# Patient Record
Sex: Male | Born: 1965 | Race: Black or African American | Hispanic: No | Marital: Single
Health system: Southern US, Academic
[De-identification: ages and names within clinical notes are randomized; demographics above are authoritative.]

## PROBLEM LIST (undated history)

## (undated) ENCOUNTER — Telehealth

## (undated) ENCOUNTER — Encounter

## (undated) ENCOUNTER — Encounter: Attending: Nephrology | Primary: Nephrology

## (undated) ENCOUNTER — Encounter: Attending: Gastroenterology | Primary: Gastroenterology

## (undated) ENCOUNTER — Ambulatory Visit: Payer: MEDICARE

## (undated) ENCOUNTER — Inpatient Hospital Stay: Payer: MEDICARE

## (undated) ENCOUNTER — Encounter: Attending: Anesthesiology | Primary: Anesthesiology

## (undated) ENCOUNTER — Ambulatory Visit: Payer: MEDICARE | Attending: Nephrology | Primary: Nephrology

## (undated) ENCOUNTER — Ambulatory Visit: Payer: Medicare (Managed Care)

## (undated) ENCOUNTER — Telehealth: Attending: Certified Registered" | Primary: Certified Registered"

## (undated) ENCOUNTER — Ambulatory Visit

## (undated) DIAGNOSIS — I10 Essential (primary) hypertension: Secondary | ICD-10-CM

## (undated) DIAGNOSIS — E119 Type 2 diabetes mellitus without complications: Secondary | ICD-10-CM

## (undated) HISTORY — PX: COLONOSCOPY: SHX174

## (undated) MED ORDER — TRAMADOL 50 MG TABLET: Freq: Four times a day (QID) | ORAL | 0 days | PRN

## (undated) MED ORDER — SEMAGLUTIDE 0.25 MG OR 0.5 MG (2 MG/1.5 ML) SUBCUTANEOUS PEN INJECTOR: SUBCUTANEOUS | 0 days

## (undated) MED ORDER — INSULIN ASPAR PROT-INSULIN ASPART 100 UNIT/ML (70-30) SUBCUTANEOUS PEN: Freq: Three times a day (TID) | SUBCUTANEOUS | 0 days

## (undated) MED ORDER — LISINOPRIL 10 MG TABLET: Freq: Every day | ORAL | 0.00000 days

---
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
## 2014-06-18 ENCOUNTER — Inpatient Hospital Stay (HOSPITAL_COMMUNITY)
Admission: AD | Admit: 2014-06-18 | Discharge: 2014-06-22 | DRG: 418 | Disposition: A | Discharge status: Home / Self Care | Payer: 59 | Source: Ambulatory Visit | Attending: Internal Medicine | Admitting: Internal Medicine

## 2014-06-18 ENCOUNTER — Encounter (HOSPITAL_COMMUNITY): Payer: Self-pay | Admitting: General Practice

## 2014-06-18 ENCOUNTER — Inpatient Hospital Stay (HOSPITAL_COMMUNITY): Payer: 59

## 2014-06-18 ENCOUNTER — Ambulatory Visit (HOSPITAL_COMMUNITY)
Admission: RE | Admit: 2014-06-18 | Discharge: 2014-06-18 | Disposition: A | Discharge status: Home / Self Care | Payer: 59 | Source: Ambulatory Visit | Attending: Internal Medicine | Admitting: Internal Medicine

## 2014-06-18 ENCOUNTER — Other Ambulatory Visit (HOSPITAL_COMMUNITY): Payer: Self-pay | Admitting: Internal Medicine

## 2014-06-18 DIAGNOSIS — I10 Essential (primary) hypertension: Secondary | ICD-10-CM | POA: Diagnosis present

## 2014-06-18 DIAGNOSIS — Z7982 Long term (current) use of aspirin: Secondary | ICD-10-CM

## 2014-06-18 DIAGNOSIS — I2510 Atherosclerotic heart disease of native coronary artery without angina pectoris: Secondary | ICD-10-CM | POA: Diagnosis present

## 2014-06-18 DIAGNOSIS — R945 Abnormal results of liver function studies: Principal | ICD-10-CM

## 2014-06-18 DIAGNOSIS — E1165 Type 2 diabetes mellitus with hyperglycemia: Secondary | ICD-10-CM | POA: Diagnosis present

## 2014-06-18 DIAGNOSIS — K851 Biliary acute pancreatitis without necrosis or infection: Secondary | ICD-10-CM | POA: Diagnosis present

## 2014-06-18 DIAGNOSIS — E1121 Type 2 diabetes mellitus with diabetic nephropathy: Secondary | ICD-10-CM | POA: Diagnosis present

## 2014-06-18 DIAGNOSIS — Z794 Long term (current) use of insulin: Secondary | ICD-10-CM | POA: Diagnosis not present

## 2014-06-18 DIAGNOSIS — R112 Nausea with vomiting, unspecified: Secondary | ICD-10-CM

## 2014-06-18 DIAGNOSIS — E785 Hyperlipidemia, unspecified: Secondary | ICD-10-CM | POA: Diagnosis present

## 2014-06-18 DIAGNOSIS — R7989 Other specified abnormal findings of blood chemistry: Secondary | ICD-10-CM

## 2014-06-18 DIAGNOSIS — F17210 Nicotine dependence, cigarettes, uncomplicated: Secondary | ICD-10-CM | POA: Diagnosis present

## 2014-06-18 DIAGNOSIS — F17200 Nicotine dependence, unspecified, uncomplicated: Secondary | ICD-10-CM

## 2014-06-18 DIAGNOSIS — K8000 Calculus of gallbladder with acute cholecystitis without obstruction: Secondary | ICD-10-CM | POA: Diagnosis present

## 2014-06-18 DIAGNOSIS — IMO0002 Reserved for concepts with insufficient information to code with codable children: Secondary | ICD-10-CM

## 2014-06-18 DIAGNOSIS — R000 Tachycardia, unspecified: Secondary | ICD-10-CM | POA: Diagnosis not present

## 2014-06-18 DIAGNOSIS — Z9049 Acquired absence of other specified parts of digestive tract: Secondary | ICD-10-CM | POA: Diagnosis present

## 2014-06-18 DIAGNOSIS — R109 Unspecified abdominal pain: Secondary | ICD-10-CM

## 2014-06-18 DIAGNOSIS — K8020 Calculus of gallbladder without cholecystitis without obstruction: Secondary | ICD-10-CM

## 2014-06-18 HISTORY — DX: Type 2 diabetes mellitus without complications: E11.9

## 2014-06-18 LAB — GLUCOSE, CAPILLARY: Glucose-Capillary: 198 mg/dL — ABNORMAL HIGH (ref 70–99)

## 2014-06-18 MED ORDER — INSULIN ASPART 100 UNIT/ML ~~LOC~~ SOLN
0.0000 [IU] | Freq: Three times a day (TID) | SUBCUTANEOUS | Status: DC
  Administered 2014-06-19: 2 [IU] via SUBCUTANEOUS
  Administered 2014-06-20: 5 [IU] via SUBCUTANEOUS
  Administered 2014-06-20: 2 [IU] via SUBCUTANEOUS
  Administered 2014-06-20 – 2014-06-21 (×2): 3 [IU] via SUBCUTANEOUS
  Administered 2014-06-22 (×2): 5 [IU] via SUBCUTANEOUS

## 2014-06-18 MED ORDER — PANTOPRAZOLE SODIUM 40 MG IV SOLR
40.0000 mg | Freq: Two times a day (BID) | INTRAVENOUS | Status: AC
  Administered 2014-06-18 – 2014-06-19 (×3): 40 mg via INTRAVENOUS
  Filled 2014-06-18 (×3): qty 40

## 2014-06-18 MED ORDER — ACETAMINOPHEN 325 MG PO TABS: 650.0000 mg | ORAL_TABLET | Freq: Four times a day (QID) | ORAL | Status: DC | PRN

## 2014-06-18 MED ORDER — IOHEXOL 300 MG/ML  SOLN
50.0000 mL | Freq: Once | INTRAMUSCULAR | Status: AC | PRN
  Administered 2014-06-18: 50 mL via ORAL

## 2014-06-18 MED ORDER — HYDROMORPHONE HCL 1 MG/ML IJ SOLN
0.5000 mg | INTRAMUSCULAR | Status: DC | PRN
Start: 2014-06-18 — End: 2014-06-22
  Administered 2014-06-21 – 2014-06-22 (×2): 0.5 mg via INTRAVENOUS
  Filled 2014-06-18 (×2): qty 1

## 2014-06-18 MED ORDER — ONDANSETRON HCL 4 MG/2ML IJ SOLN: 4.0000 mg | Freq: Four times a day (QID) | INTRAMUSCULAR | Status: DC | PRN

## 2014-06-18 MED ORDER — HEPARIN SODIUM (PORCINE) 5000 UNIT/ML IJ SOLN
5000.0000 [IU] | Freq: Three times a day (TID) | INTRAMUSCULAR | Status: DC
  Administered 2014-06-18 – 2014-06-22 (×8): 5000 [IU] via SUBCUTANEOUS
  Filled 2014-06-18 (×12): qty 1

## 2014-06-18 MED ORDER — HYDRALAZINE HCL 20 MG/ML IJ SOLN
10.0000 mg | Freq: Three times a day (TID) | INTRAMUSCULAR | Status: DC | PRN
  Administered 2014-06-18 – 2014-06-22 (×4): 10 mg via INTRAVENOUS
  Filled 2014-06-18 (×4): qty 1

## 2014-06-18 MED ORDER — SODIUM CHLORIDE 0.9 % IJ SOLN
3.0000 mL | Freq: Two times a day (BID) | INTRAMUSCULAR | Status: DC
  Administered 2014-06-22: 3 mL via INTRAVENOUS

## 2014-06-18 MED ORDER — ACETAMINOPHEN 650 MG RE SUPP: 650.0000 mg | Freq: Four times a day (QID) | RECTAL | Status: DC | PRN

## 2014-06-18 MED ORDER — INSULIN ASPART 100 UNIT/ML ~~LOC~~ SOLN
0.0000 [IU] | Freq: Every day | SUBCUTANEOUS | Status: DC
Start: 2014-06-18 — End: 2014-06-22
  Administered 2014-06-19: 2 [IU] via SUBCUTANEOUS
  Administered 2014-06-21: 1 [IU] via SUBCUTANEOUS

## 2014-06-18 MED ORDER — ONDANSETRON HCL 4 MG PO TABS: 4.0000 mg | ORAL_TABLET | Freq: Four times a day (QID) | ORAL | Status: DC | PRN

## 2014-06-18 MED ORDER — INSULIN GLARGINE 100 UNIT/ML ~~LOC~~ SOLN
15.0000 [IU] | Freq: Every day | SUBCUTANEOUS | Status: DC
  Administered 2014-06-18 – 2014-06-21 (×4): 15 [IU] via SUBCUTANEOUS
  Filled 2014-06-18 (×4): qty 0.15

## 2014-06-18 MED ORDER — SODIUM CHLORIDE 0.9 % IV SOLN
INTRAVENOUS | Status: DC
  Administered 2014-06-18 – 2014-06-21 (×6): via INTRAVENOUS

## 2014-06-18 NOTE — H&P (Addendum)
Vital Signs  Entered weight:  219  lbs., Calculated Weight: 219 lbs., ( 99.34 kg) Height: 68 in., ( 172.72 cm) Temperature: 98.1 deg F, Temperature site: oral Pulse rate: 88 Pulse rhythm: regular  Blood Pressure #1: 156 / 98 mm Hg    BMI: 33.30 BSA: 2.13 Wt Chg: 1 lbs since 05/20/2014  Vitals entered by: Prudencio Pair, CMA on June 18, 2014 3:19 PM  Pulse Oximetry  O2 Saturation: 98 %           Risk Factors:   Smoked Tobacco Use:  Current every day smoker    Cigarettes:  Yes -- 1pk/wk pack(s) per day,      Year started:  1994       Years smoked:  7 Smokeless Tobacco Use:  Never    Counseled to quit/cut down:  yes Passive smoke exposure:  no Drug use:  no HIV high-risk behavior:  no Caffeine use:  <1 drinks per day Alcohol use:  yes    Type:  occ beer    Drinks per day:  <1    Has patient --       Felt need to cut down:  no       Been annoyed by complaints:  no       Felt guilty about drinking:  no       Needed eye opener in the morning:  no    Counseled to quit/cut down alcohol use:  no Exercise:  yes    Times per week:  3    Type of Exercise:  walk Seatbelt use:  100 % Sun Exposure:  frequently  Family History Risk Factors:    Family History of MI in males < 36 years old:  no  Previous Tobacco Use: Signed On - 05/20/2014 Smoked Tobacco Use:  Current every day smoker    Cigarettes:  Yes -- 1pk/wk pack(s) per day,      Year started:  1994       Years smoked:  7 Smokeless Tobacco Use:  Never    Counseled to quit/cut down:  yes Passive smoke exposure:  no Drug use:  no HIV high-risk behavior:  no Caffeine use:  <1 drinks per day  Previous Alcohol Use: Signed On - 05/20/2014 Alcohol use:  yes    Type:  occ beer    Drinks per day:  <1    Has patient --       Felt need to cut down:  no       Been annoyed by complaints:  no       Felt guilty about drinking:  no       Needed eye opener in the morning:  no    Counseled to quit/cut down alcohol  use:  no Exercise:  yes    Times per week:  3    Type of Exercise:  walk Seatbelt use:  100 % Sun Exposure:  frequently  Family History Risk Factors:    Family History of MI in males < 63 years old:  no  History of Present Illness  History from: patient Reason for visit: See chief complaint Chief Complaint: Patient started having upper mid abdominal pain Saturday and it has continued every day. He has had nausea and vomiting, loss of appetite and very dark stool.   History of Present Illness: 49 y/o M presented to office today c 5-6 days Epigastric pains, N, resolved vomiting, Anorexia, back and flank pains.  Started with acute  onset of Mid to LUQ abd pain Sat am with mild nausea and intermittent vomiting during the week and noticing darker stools with no jaundice, fever, chills, or BRPPR.  Felt fine when went to bed on Fri pm.  Hasn't kept him from going to work this wk.  Admits to drinking about a 6 pk beer over w/e and did do that on Friday pm before going to bed.    Says rarely drinks during week  Hx DM with home bs running 98-195 , average 130-149 most times Didn't take Metformin or Insulin today, didn't think about taking it to work as he didn't feel well today and thought may not eat alot Has been eating bland foods and taking in liquids ok No vomiting in 48 hrs  Hgb A1C: 8.5 Last labs in Aug showed creat 1.2, AST/ALT 16/17 and Alk phos 61 Hgb 16.3 Trig 77  He did not look toxic in office and said he was actually feeling better. We did some labs and cr was fine, glucose was 277, LFTs were moderately elevated c T bili norma., and WBC 6.1.  We gave him the option of direct admit or outpt eval c adding on pancreatitis labs and getting CT.  He elected the latter.  The Amylase came back 263 and Lipase came back 547.  CT showed Biliary sludge and stones c Head of Pancreas Pancreatitis.  I called pt and directly admitted him for IVF, pain control, GI/Surg eval as he will need Lap chole  early to mid week after GB cools down.  I repeated TG and they were normal.  I called bed control and he will be directly      GLUCOSE              [H]  277 mg/dl                   60-110   BUN                       10 mg/dl                    5-23   CREATININE                1.0 mg/dl                   0.3-1.5  eGFR Non-African American  80.1  eGFR African American 96.9   SODIUM                    141 mEq/L                   135-148   POTASSIUM                 3.7 mEq/L                   3.5-5.3   CHLORIDE                  103 mEq/L                   80-111   CO2                  [H]  37 mEq/L                    15-35   CALCIUM  8.4 mg/dL                   7.0-10.5   TOTAL PROTEIN             6.7 g/dL                    6.0-8.5   ALBUMIN                   3.1 g/dL                    2.0-5.5   AST                  [H]  70 IU/L                     7-45   ALT                  [HH] 114 IU/L                    5-40     RES=RESULT VERIFIED AND REPORTED TO PHYSICIAN   ALK PHOS             [HH] 303 IU/L                    37-137     RES=RESULT VERIFIED AND REPORTED TO PHYSICIAN   TOTAL BILIRUBIN           0.5 mg/dl                   0.1-1.5  Tests: (2) CBC (2000)   WBC                       6.10 K/uL                   4.10-10.90   LYM                       2.1 K/uL                    0.6-4.1 ! MID                       0.9 K/uL                    0.0-1.8   GRAN                      3.1 K/uL                    2.0-7.8   LYM%                      34.8 %                      10.0-58.5 ! MID%                      14.2 %                      0.1-24.0   GRAN%                     51.0  37.0-92.0   RBC                       4.9 M/uL                    4.2-6.3   HGB                       14.5 g/dL                   12.0-18.0   HCT                       42.6 %                      37.0-51.0   MCV                       87.8 fL                     80.0-97.0    MCH                       29.9 pg                     26.0-32.0   MCHC                      34.0 g/dL                   31.0-36.0   PLT                       197 K/uL                    140-440  Review of Systems  General:       Complains of anorexia, fatigue.        Denies fevers, chills, sweats.   Eyes:       Denies blurring, diplopia, irritation, discharge, vision loss, eye pain, photophobia.   Ears/Nose/Throat:       Denies earache, nasal congestion, nosebleeds, sore throat, hoarseness.   Cardiovascular:       Denies chest pains, claudication, syncope, dyspnea on exertion, orthopnea, PND, peripheral edema.   Respiratory:       Denies cough, dyspnea, excessive sputum, hemoptysis, wheezing.   Gastrointestinal:       Complains of nausea, vomiting, abdominal pain, melena.        Denies diarrhea, constipation, heartburn, change in bowel habits, hematochezia, jaundice.   Genitourinary:       Denies dysuria, hematuria, discharge, urinary frequency, urinary hesitancy, nocturia, incontinence.   Musculoskeletal:       Complains of back pain.        Denies joint pain.   Skin:       Denies rash, itching, dryness.   Neurologic:       Denies vertigo, dizziness.   Endocrine:       Denies polydipsia.   Heme/Lymphatic:       Denies abnormal bruising, bleeding, enlarged lymph nodes.    Past History Past Medical History (reviewed - no changes required): arthritis  DM, type 2 w/ proteinuria // HTN // HLD    Surgical History (reviewed - no changes required): none  Family History (reviewed - no  changes required): mom - DM type 1 , HTN  CVA < 76 - sister  Social History (reviewed - no changes required): single. moved in 2012 from [place], [place] after he lost his job, moved to be w/ brother. works as Presenter, broadcasting.  etoh: occ  tob: 1/3 ppd. since 1994  drugs: no hx of IV drugs   Family History Summary:     Reviewed history Last on 05/20/2014 and no changes required:06/18/2014   General  Comments - FH: mom - DM type 1 , HTN  CVA < 43 - sister   Social History:    Reviewed history from 11/10/2012 and no changes required:       single. moved in 2012 from [place], [place] after he lost his job, moved to be w/ brother. works as Presenter, broadcasting.        etoh: occ        tob: 1/3 ppd. since 1994        drugs: no hx of IV drugs    Physical Exam  General appearance: well nourished, well hydrated, no acute distress  Eyes  External: conjunctivae and lids normal  Ears, Nose and Throat  Nasal: mucosa, septum, and turbinates normal Pharynx: tongue normal, protrudes mid line,  posterior pharynx without erythema or exudate  Neck  Neck: supple, no masses, trachea midline  Respiratory  Respiratory effort: no intercostal retractions or use of accessory muscles Auscultation: no rales, rhonchi, or wheezes  Cardiovascular  Auscultation: S1, S2, no murmur, rub, or gallop  Gastrointestinal  Abdomen: soft, non-tender, no masses, bowel sounds normal Liver and spleen: no enlargement or nodularity Stool: Heme slightly positive on digital exam  Lymphatic  Neck: no cervical adenopathy  Musculoskeletal  Gait and station: normal  Skin  Inspection: no rashes, lesions  Neurologic  Sensation: intact to touch, vibration Speech: Normal  Mental Status Exam  Orientation: oriented to time, place, and person   Impression & Recommendations: Gallstone Pancreatitis/ Nausea and vomiting -  CT Abdomen and Pelvis W/Contrast - Head of Pancreas Pancreatitis: 1. Gallbladder distention with possible dependent sludge or stones. No specific evidence of acute cholecystitis. 2. Edema within the right anterior pararenal space, adjacent to the descending duodenum. Considerations include focal pancreatitis involving the head/uncinate process, otherwise occult early acute cholecystitis, or duodenitis. Consider correlation with pancreatic enzyme levels and possibly right upper quadrant ultrasound. zofran  78m one every 6 hrs as needed IVF. Pain control. GI eval for poss ERCP.  I discussed case c Dr MCollene Maresand GI will see him over the weekend. Without fever/WBC count or issue on CT - Hold Abx. Gen Surg Eval for Lap Chole in 2-4 days   Abdominal pain/elevated LFT's = elevated AST/ALT 70 and 114 and alk phos 303 with Total bili 0.5 normal WBC 6 Hemocult mildly + Normal WBC, no anemia   Hypertension - Will use Hydral prn in house    Lisinopril 20 Mg Tabs (Lisinopril) ..Marland Kitchen.. Take 1 tablet by mouth every day elevated today at 156/98 in pain, acute process   Diabetes mellitus, type II, uncontrolled Lantus and ISS in house  Hold Metformin. Hgb A1C: 8.5 gluc 277  no insulin or metformin today creat 1.0  Nephropathy, diabetic c Microalb but nml Cr.   creat 1.0 today Hgb A1C: 8.5  CT also showed - Age advanced atherosclerosis. Suspect age advanced coronary artery disease. He was on ACEi, Atorvastatin and ASA. Will get EKG in anticipation of upcoming surgery. Needs RF's dealt c aggressively Saw Cards-  Dr Einar Gip- 9/3.  Set up for Stress and ABIs.  I discussed c Dr Einar Gip and he will place results in chart tomorrow.   Tobacco use - Needs to quit Counseled patient on the risks of smoking on health, the benefits of quitting, and the options to help aid the patient in their decision to limit or quit tobacco abuse. smoking few cigs daily  Hyperlipidemia - restart Atorvastatin when taking POs  DVT Proph - SQ Hep as can be held quicker for procedures.  Allergy to shrimp

## 2014-06-19 LAB — CBC
HCT: 41.8 % (ref 39.0–52.0)
Hemoglobin: 14 g/dL (ref 13.0–17.0)
MCH: 27.8 pg (ref 26.0–34.0)
MCHC: 33.5 g/dL (ref 30.0–36.0)
MCV: 82.9 fL (ref 78.0–100.0)
Platelets: 162 10*3/uL (ref 150–400)
RBC: 5.04 MIL/uL (ref 4.22–5.81)
RDW: 13.9 % (ref 11.5–15.5)
WBC: 7.2 10*3/uL (ref 4.0–10.5)

## 2014-06-19 LAB — LIPASE, BLOOD: Lipase: 301 U/L — ABNORMAL HIGH (ref 11–59)

## 2014-06-19 LAB — COMPREHENSIVE METABOLIC PANEL
ALBUMIN: 3.2 g/dL — AB (ref 3.5–5.2)
ALT: 98 U/L — AB (ref 0–53)
ANION GAP: 6 (ref 5–15)
AST: 67 U/L — AB (ref 0–37)
Alkaline Phosphatase: 329 U/L — ABNORMAL HIGH (ref 39–117)
BILIRUBIN TOTAL: 0.8 mg/dL (ref 0.3–1.2)
BUN: 9 mg/dL (ref 6–23)
CO2: 29 mmol/L (ref 19–32)
Calcium: 7.9 mg/dL — ABNORMAL LOW (ref 8.4–10.5)
Chloride: 102 mmol/L (ref 96–112)
Creatinine, Ser: 0.93 mg/dL (ref 0.50–1.35)
GFR calc Af Amer: >90 mL/min (ref >90)
Glucose, Bld: 171 mg/dL — ABNORMAL HIGH (ref 70–99)
POTASSIUM: 3.6 mmol/L (ref 3.5–5.1)
Sodium: 137 mmol/L (ref 135–145)
Total Protein: 6.4 g/dL (ref 6.0–8.3)

## 2014-06-19 LAB — GLUCOSE, CAPILLARY
GLUCOSE-CAPILLARY: 138 mg/dL — AB (ref 70–99)
Glucose-Capillary: 83 mg/dL (ref 70–99)
Glucose-Capillary: 112 mg/dL — ABNORMAL HIGH (ref 70–99)
Glucose-Capillary: 221 mg/dL — ABNORMAL HIGH (ref 70–99)

## 2014-06-19 LAB — PROTIME-INR
INR: 1.05 (ref 0.00–1.49)
Prothrombin Time: 13.8 seconds (ref 11.6–15.2)

## 2014-06-19 LAB — APTT: aPTT: 29 seconds (ref 24–37)

## 2014-06-19 LAB — AMYLASE: Amylase: 202 U/L — ABNORMAL HIGH (ref 0–105)

## 2014-06-19 MED ORDER — DEXTROSE-NACL 5-0.9 % IV SOLN
INTRAVENOUS | Status: DC
  Administered 2014-06-19 – 2014-06-22 (×6): via INTRAVENOUS

## 2014-06-19 NOTE — Progress Notes (Signed)
Subjective: Admitted yesterday c Gallstone Pancreatitis. He looks good today and is not in pain. Amylase 202 Lipase down to 301. LFTs down some. He looks much better than he ought to.  CXR fine EKG P. Had Stress c Dr Einar Gip Oct and told fine. Getting results to chart soon. Hungry   Objective: Vital signs in last 24 hours: Temp:  [97.5 F (36.4 C)-98.8 F (37.1 C)] 97.5 F (36.4 C) (02/06 0422) Pulse Rate:  [83-92] 92 (02/06 0422) Resp:  [20] 20 (02/06 0422) BP: (149-176)/(90-101) 149/90 mmHg (02/06 0422) SpO2:  [99 %-100 %] 99 % (02/06 0422) Weight:  [97.75 kg (215 lb 8 oz)-97.886 kg (215 lb 12.8 oz)] 97.886 kg (215 lb 12.8 oz) (02/06 0422) Weight change:  Last BM Date: 06/18/14  CBG (last 3)   Recent Labs  06/18/14 2249 06/19/14 0735  GLUCAP 198* 138*    Intake/Output from previous day:  Intake/Output Summary (Last 24 hours) at 06/19/14 0918 Last data filed at 06/19/14 0600  Gross per 24 hour  Intake 935.42 ml  Output      0 ml  Net 935.42 ml   02/05 0701 - 02/06 0700 In: 935.4 [I.V.:935.4] Out: -    Physical Exam  General appearance: A and O Eyes: no scleral icterus Throat: oropharynx moist without erythema Resp: CTA B Cardio: Reg GI: soft, non-tender; bowel sounds normal; no masses, no organomegaly Extremities: no clubbing, cyanosis or edema   Lab Results:  Recent Labs  06/19/14 0605  NA 137  K 3.6  CL 102  CO2 29  GLUCOSE 171*  BUN 9  CREATININE 0.93  CALCIUM 7.9*     Recent Labs  06/19/14 0605  AST 67*  ALT 98*  ALKPHOS 329*  BILITOT 0.8  PROT 6.4  ALBUMIN 3.2*     Recent Labs  06/19/14 0605  WBC 7.2  HGB 14.0  HCT 41.8  MCV 82.9  PLT 162    Lab Results  Component Value Date   INR 1.05 06/19/2014    No results for input(s): CKTOTAL, CKMB, CKMBINDEX, TROPONINI in the last 72 hours.  No results for input(s): TSH, T4TOTAL, T3FREE, THYROIDAB in the last 72 hours.  Invalid input(s): FREET3  No results for  input(s): VITAMINB12, FOLATE, FERRITIN, TIBC, IRON, RETICCTPCT in the last 72 hours.  Micro Results: No results found for this or any previous visit (from the past 240 hour(s)).   Studies/Results: Ct Abdomen Pelvis W Contrast  06/18/2014   CLINICAL DATA:  Initial encounter for upper abdominal pain. Nausea and vomiting. Elevated liver function tests.  EXAM: CT ABDOMEN AND PELVIS WITH CONTRAST  TECHNIQUE: Multidetector CT imaging of the abdomen and pelvis was performed using the standard protocol following bolus administration of intravenous contrast.  CONTRAST:  16m OMNIPAQUE IOHEXOL 300 MG/ML  SOLN  COMPARISON:  None.  FINDINGS: Lower chest: Scarring at the left lung base. Normal heart size without pericardial or pleural effusion. Probable left coronary artery atherosclerosis. Example image 1.  Hepatobiliary: Mild hepatic steatosis, without focal liver lesion. Gallbladder distention, 12.4 cm on sagittal image 38. Possible small gallstones or sludge dependently. No pericholecystic inflammation identified. No biliary ductal dilatation.  Pancreas: No focal pancreatic abnormality or ductal dilatation.  Spleen: Normal  Adrenals/Urinary Tract: Normal adrenal glands. Normal kidneys, without hydronephrosis. Normal urinary bladder.  Stomach/Bowel: Normal stomach, without wall thickening. Normal colon, appendix, and terminal ileum. Normal small bowel.  Vascular/Lymphatic: Normal caliber of the aorta and branch vessels. Advanced for age common iliac artery atherosclerosis  bilaterally. No abdominopelvic adenopathy.  Reproductive: Normal prostate.  Other: Edema is identified adjacent to the descending duodenum, including on image 44 of series 2. Interstitial thickening extends throughout the right anterior pararenal space, mild. No ascites. No significant free fluid.  Musculoskeletal: No acute osseous abnormality. Disc bulges at L3-4, L4-5, and L5-S1  IMPRESSION: 1. Gallbladder distention with possible dependent sludge  or stones. No specific evidence of acute cholecystitis. 2. Edema within the right anterior pararenal space, adjacent to the descending duodenum. Considerations include focal pancreatitis involving the head/uncinate process, otherwise occult early acute cholecystitis, or duodenitis. Consider correlation with pancreatic enzyme levels and possibly right upper quadrant ultrasound. 3. Age advanced atherosclerosis. Suspect age advanced coronary artery disease. Correlate with risk factors and consider medical therapy.   Electronically Signed   By: Abigail Miyamoto M.D.   On: 06/18/2014 20:14   Dg Chest Port 1 View  06/19/2014   CLINICAL DATA:  Gallstone pancreatitis.  EXAM: PORTABLE CHEST - 1 VIEW  COMPARISON:  None.  FINDINGS: There is elevation of right hemidiaphragm. Low lung volumes. The cardiomediastinal contours are normal. The lungs are clear. Pulmonary vasculature is normal. No consolidation, pleural effusion, or pneumothorax. No acute osseous abnormalities are seen.  IMPRESSION: No acute pulmonary process.  Elevation of right hemidiaphragm.   Electronically Signed   By: Jeb Levering M.D.   On: 06/19/2014 01:59     Medications: Scheduled: . heparin  5,000 Units Subcutaneous 3 times per day  . insulin aspart  0-15 Units Subcutaneous TID WC  . insulin aspart  0-5 Units Subcutaneous QHS  . insulin glargine  15 Units Subcutaneous QHS  . pantoprazole (PROTONIX) IV  40 mg Intravenous Q12H  . sodium chloride  3 mL Intravenous Q12H   Continuous: . sodium chloride 125 mL/hr at 06/19/14 0423     Assessment/Plan: Active Problems:   Gallstone pancreatitis   DM (diabetes mellitus), type 2, uncontrolled   Smoker   Essential hypertension   Hyperlipidemia   Coronary atherosclerosis  Gallstone Pancreatitis/ Nausea and vomiting - CT Abdomen and Pelvis W/Contrast - Head of Pancreas Pancreatitis: 1. Gallbladder distention with possible dependent sludge or stones. No specific evidence of acute  cholecystitis. 2. Edema within the right anterior pararenal space, adjacent to the descending duodenum. Considerations include focal pancreatitis involving the head/uncinate process, otherwise occult early acute cholecystitis, or duodenitis. Consider correlation with pancreatic enzyme levels and possibly right upper quadrant ultrasound. Less Nausea and less pain - zofran prn/IVF. Pain control. Surgery consulted and may not need ERCP - Will let them work this out c GI GI eval for poss ERCP. I discussed case c Dr Collene Mares last night and GI will see him over the weekend. Without fever/WBC count or issue on CT - Hold Abx. LFTs and pancreatic Enzymes improving  Abdominal pain/elevated LFT's = elevated AST/ALT 70 and 114 and alk phos 303 with Total bili 0.5 normal WBC   Hypertension - Will use Hydral prn in house  Lisinopril 20 Mg Tabs (Lisinopril) .Marland Kitchen... Take 1 tablet by mouth every day  Nephropathy, diabetic c Microalb but nml Cr.  creat 1.0 Hgb A1C: 8.5  CT also showed - Age advanced atherosclerosis. Suspect age advanced coronary artery disease. He was on ACEi, Atorvastatin and ASA. Will get EKG in anticipation of upcoming surgery. Needs RF's dealt c aggressively Saw Cards- Dr Einar Gip- 9/3. Set up for Stress and ABIs. I discussed c Dr Einar Gip and he will place results in chart today  Tobacco use - Needs to  quit Counseled patient on the risks of smoking on health, the benefits of quitting, and the options to help aid the patient in their decision to limit or quit tobacco abuse. smoking few cigs daily  Hyperlipidemia - restart Atorvastatin when taking POs  DVT Proph - SQ Hep as can be held quicker for procedures.  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 creat 1.0  -  Recent Labs  06/18/14 2249 06/19/14 0735  GLUCAP 198* 138*    LOS: 1 day   Adam Baker M 06/19/2014, 9:18 AM

## 2014-06-19 NOTE — Consult Note (Signed)
Reason for Consult:pancreatits Referring Physician: Dr. Philemon Kingdom Adam Baker is an 49 y.o. male.  HPI: The pt is a 49 yo bm who presents with epigastric abdominal pain that started a week ago. It was associated with nausea and vomiting. He saw his medical doc on Friday who got a CT and labwork which showed gallstone pancreatitis. He was admitted. He is feeling much better  Past Medical History  Diagnosis Date  . Diabetes mellitus without complication     Past Surgical History  Procedure Laterality Date  . Colonoscopy      Family History  Problem Relation Age of Onset  . Diabetes Mother   . Diabetes Sister   . Diabetes Brother     Social History:  reports that he has been smoking Cigarettes.  He has never used smokeless tobacco. He reports that he drinks alcohol. He reports that he does not use illicit drugs.  Allergies:  Allergies  Allergen Reactions  . Penicillins Nausea Only  . Shrimp [Shellfish Allergy] Nausea And Vomiting    Medications: I have reviewed the patient's current medications.  Results for orders placed or performed during the hospital encounter of 06/18/14 (from the past 48 hour(s))  Glucose, capillary     Status: Abnormal   Collection Time: 06/18/14 10:49 PM  Result Value Ref Range   Glucose-Capillary 198 (H) 70 - 99 mg/dL  Comprehensive metabolic panel     Status: Abnormal   Collection Time: 06/19/14  6:05 AM  Result Value Ref Range   Sodium 137 135 - 145 mmol/L   Potassium 3.6 3.5 - 5.1 mmol/L   Chloride 102 96 - 112 mmol/L   CO2 29 19 - 32 mmol/L   Glucose, Bld 171 (H) 70 - 99 mg/dL   BUN 9 6 - 23 mg/dL   Creatinine, Ser 0.93 0.50 - 1.35 mg/dL   Calcium 7.9 (L) 8.4 - 10.5 mg/dL   Total Protein 6.4 6.0 - 8.3 g/dL   Albumin 3.2 (L) 3.5 - 5.2 g/dL   AST 67 (H) 0 - 37 U/L   ALT 98 (H) 0 - 53 U/L   Alkaline Phosphatase 329 (H) 39 - 117 U/L   Total Bilirubin 0.8 0.3 - 1.2 mg/dL   GFR calc non Af Amer >90 >90 mL/min   GFR calc Af Amer >90 >90  mL/min    Comment: (NOTE) The eGFR has been calculated using the CKD EPI equation. This calculation has not been validated in all clinical situations. eGFR's persistently <90 mL/min signify possible Chronic Kidney Disease.    Anion gap 6 5 - 15  CBC     Status: None   Collection Time: 06/19/14  6:05 AM  Result Value Ref Range   WBC 7.2 4.0 - 10.5 K/uL   RBC 5.04 4.22 - 5.81 MIL/uL   Hemoglobin 14.0 13.0 - 17.0 g/dL   HCT 41.8 39.0 - 52.0 %   MCV 82.9 78.0 - 100.0 fL   MCH 27.8 26.0 - 34.0 pg   MCHC 33.5 30.0 - 36.0 g/dL   RDW 13.9 11.5 - 15.5 %   Platelets 162 150 - 400 K/uL  Protime-INR     Status: None   Collection Time: 06/19/14  6:05 AM  Result Value Ref Range   Prothrombin Time 13.8 11.6 - 15.2 seconds   INR 1.05 0.00 - 1.49  APTT     Status: None   Collection Time: 06/19/14  6:05 AM  Result Value Ref Range   aPTT 29  24 - 37 seconds  Amylase     Status: Abnormal   Collection Time: 06/19/14  6:05 AM  Result Value Ref Range   Amylase 202 (H) 0 - 105 U/L  Lipase, blood     Status: Abnormal   Collection Time: 06/19/14  6:05 AM  Result Value Ref Range   Lipase 301 (H) 11 - 59 U/L  Glucose, capillary     Status: Abnormal   Collection Time: 06/19/14  7:35 AM  Result Value Ref Range   Glucose-Capillary 138 (H) 70 - 99 mg/dL    Ct Abdomen Pelvis W Contrast  06/18/2014   CLINICAL DATA:  Initial encounter for upper abdominal pain. Nausea and vomiting. Elevated liver function tests.  EXAM: CT ABDOMEN AND PELVIS WITH CONTRAST  TECHNIQUE: Multidetector CT imaging of the abdomen and pelvis was performed using the standard protocol following bolus administration of intravenous contrast.  CONTRAST:  38m OMNIPAQUE IOHEXOL 300 MG/ML  SOLN  COMPARISON:  None.  FINDINGS: Lower chest: Scarring at the left lung base. Normal heart size without pericardial or pleural effusion. Probable left coronary artery atherosclerosis. Example image 1.  Hepatobiliary: Mild hepatic steatosis, without focal  liver lesion. Gallbladder distention, 12.4 cm on sagittal image 38. Possible small gallstones or sludge dependently. No pericholecystic inflammation identified. No biliary ductal dilatation.  Pancreas: No focal pancreatic abnormality or ductal dilatation.  Spleen: Normal  Adrenals/Urinary Tract: Normal adrenal glands. Normal kidneys, without hydronephrosis. Normal urinary bladder.  Stomach/Bowel: Normal stomach, without wall thickening. Normal colon, appendix, and terminal ileum. Normal small bowel.  Vascular/Lymphatic: Normal caliber of the aorta and branch vessels. Advanced for age common iliac artery atherosclerosis bilaterally. No abdominopelvic adenopathy.  Reproductive: Normal prostate.  Other: Edema is identified adjacent to the descending duodenum, including on image 44 of series 2. Interstitial thickening extends throughout the right anterior pararenal space, mild. No ascites. No significant free fluid.  Musculoskeletal: No acute osseous abnormality. Disc bulges at L3-4, L4-5, and L5-S1  IMPRESSION: 1. Gallbladder distention with possible dependent sludge or stones. No specific evidence of acute cholecystitis. 2. Edema within the right anterior pararenal space, adjacent to the descending duodenum. Considerations include focal pancreatitis involving the head/uncinate process, otherwise occult early acute cholecystitis, or duodenitis. Consider correlation with pancreatic enzyme levels and possibly right upper quadrant ultrasound. 3. Age advanced atherosclerosis. Suspect age advanced coronary artery disease. Correlate with risk factors and consider medical therapy.   Electronically Signed   By: KAbigail MiyamotoM.D.   On: 06/18/2014 20:14   Dg Chest Port 1 View  06/19/2014   CLINICAL DATA:  Gallstone pancreatitis.  EXAM: PORTABLE CHEST - 1 VIEW  COMPARISON:  None.  FINDINGS: There is elevation of right hemidiaphragm. Low lung volumes. The cardiomediastinal contours are normal. The lungs are clear. Pulmonary  vasculature is normal. No consolidation, pleural effusion, or pneumothorax. No acute osseous abnormalities are seen.  IMPRESSION: No acute pulmonary process.  Elevation of right hemidiaphragm.   Electronically Signed   By: MJeb LeveringM.D.   On: 06/19/2014 01:59    Review of Systems  Constitutional: Negative.   HENT: Negative.   Eyes: Negative.   Respiratory: Negative.   Cardiovascular: Negative.   Gastrointestinal: Positive for nausea, vomiting and abdominal pain.  Genitourinary: Negative.   Musculoskeletal: Negative.   Skin: Negative.   Neurological: Negative.   Endo/Heme/Allergies: Negative.   Psychiatric/Behavioral: Negative.    Blood pressure 149/90, pulse 92, temperature 97.5 F (36.4 C), temperature source Oral, resp. rate 20, height _0  (  1.702 m), weight 215 lb 12.8 oz (97.886 kg), SpO2 99 %. Physical Exam  Constitutional: He is oriented to person, place, and time. He appears well-developed and well-nourished.  HENT:  Head: Normocephalic and atraumatic.  Eyes: Conjunctivae and EOM are normal. Pupils are equal, round, and reactive to light.  Neck: Normal range of motion. Neck supple.  Cardiovascular: Normal rate, regular rhythm and normal heart sounds.   Respiratory: Effort normal and breath sounds normal.  GI: Soft. Bowel sounds are normal. There is no tenderness.  Musculoskeletal: Normal range of motion.  Neurological: He is alert and oriented to person, place, and time.  Skin: Skin is warm and dry.  Psychiatric: He has a normal mood and affect. His behavior is normal.    Assessment/Plan: The pt has gallstone pancreatitis. I would recommend bowel rest until lft's and pancreatic enzymes normalize. I would recommend removing gallbladder during this hospitalization to try to prevent this from happening again. I have discussed with him the risks and benefits of the surgery to remove the gallbladder as well as some of the technical aspects and he understands and agrees  with this plan.  TOTH III,Naia Ruff S 06/19/2014, 10:22 AM

## 2014-06-19 NOTE — Consult Note (Addendum)
Cross cover for [facility] Reason for Consult: Gallstone pancreatitis. Referring Physician: Dr. Shon Baton.  Adam Baker is an 49 y.o. male.  HPI: 49 year old black male with a history of AODM, HTN, Hyperlipidemia, CAD, admitted to the hospital with epigastric pain and nausea that he has had for the last 1 week. He was having some postprandial nausea off an on, worse after greasy foods for a few weeks now. He gives a history of pain radiating to his back at times. He has 1-2 BM's per day with no obvious blood or mucus in the stool. He has had a good appetite and his weight has been stable. He denies having any vomiting, dysphagia or odynophagia, fever, chills or rigors. He had a colonoscopy done about 10 years ago, when he lived in [place], [place] and reportedly it was normal. On admission, the CT scan of the abdomen and pelvis revealed a distention of gallbladder with possible stones vs sludge and edema of the head of the pancreas consistent with gallstone pancreatitis; there is no CBD dilation noted.       Past Medical History  Diagnosis Date  . Diabetes mellitus without complication    Past Surgical History  Procedure Laterality Date  . Colonoscopy     Family History  Problem Relation Age of Onset  . Diabetes Mother   . Diabetes Sister   . Diabetes Brother    Social History:  reports that he has been smoking cigarettes-1PPD over a week. He has never used smokeless tobacco. He reports that he drinks alcohol. He reports that he does not use illicit drugs.  Allergies:  Allergies  Allergen Reactions  . Penicillins Nausea Only  . Shrimp [Shellfish Allergy] Nausea And Vomiting   Medications: I have reviewed the patient's current medications.  Results for orders placed or performed during the hospital encounter of 06/18/14 (from the past 48 hour(s))  Glucose, capillary     Status: Abnormal   Collection Time: 06/18/14 10:49 PM  Result Value Ref Range   Glucose-Capillary 198 (H) 70 - 99  mg/dL  Comprehensive metabolic panel     Status: Abnormal   Collection Time: 06/19/14  6:05 AM  Result Value Ref Range   Sodium 137 135 - 145 mmol/L   Potassium 3.6 3.5 - 5.1 mmol/L   Chloride 102 96 - 112 mmol/L   CO2 29 19 - 32 mmol/L   Glucose, Bld 171 (H) 70 - 99 mg/dL   BUN 9 6 - 23 mg/dL   Creatinine, Ser 0.93 0.50 - 1.35 mg/dL   Calcium 7.9 (L) 8.4 - 10.5 mg/dL   Total Protein 6.4 6.0 - 8.3 g/dL   Albumin 3.2 (L) 3.5 - 5.2 g/dL   AST 67 (H) 0 - 37 U/L   ALT 98 (H) 0 - 53 U/L   Alkaline Phosphatase 329 (H) 39 - 117 U/L   Total Bilirubin 0.8 0.3 - 1.2 mg/dL   GFR calc non Af Amer >90 >90 mL/min   GFR calc Af Amer >90 >90 mL/min    Comment: (NOTE) The eGFR has been calculated using the CKD EPI equation. This calculation has not been validated in all clinical situations. eGFR's persistently <90 mL/min signify possible Chronic Kidney Disease.    Anion gap 6 5 - 15  CBC     Status: None   Collection Time: 06/19/14  6:05 AM  Result Value Ref Range   WBC 7.2 4.0 - 10.5 K/uL   RBC 5.04 4.22 - 5.81 MIL/uL  Hemoglobin 14.0 13.0 - 17.0 g/dL   HCT 41.8 39.0 - 52.0 %   MCV 82.9 78.0 - 100.0 fL   MCH 27.8 26.0 - 34.0 pg   MCHC 33.5 30.0 - 36.0 g/dL   RDW 13.9 11.5 - 15.5 %   Platelets 162 150 - 400 K/uL  Protime-INR     Status: None   Collection Time: 06/19/14  6:05 AM  Result Value Ref Range   Prothrombin Time 13.8 11.6 - 15.2 seconds   INR 1.05 0.00 - 1.49  APTT     Status: None   Collection Time: 06/19/14  6:05 AM  Result Value Ref Range   aPTT 29 24 - 37 seconds  Amylase     Status: Abnormal   Collection Time: 06/19/14  6:05 AM  Result Value Ref Range   Amylase 202 (H) 0 - 105 U/L  Lipase, blood     Status: Abnormal   Collection Time: 06/19/14  6:05 AM  Result Value Ref Range   Lipase 301 (H) 11 - 59 U/L  Glucose, capillary     Status: Abnormal   Collection Time: 06/19/14  7:35 AM  Result Value Ref Range   Glucose-Capillary 138 (H) 70 - 99 mg/dL   Ct Abdomen  Pelvis W Contrast  06/18/2014   CLINICAL DATA:  Initial encounter for upper abdominal pain. Nausea and vomiting. Elevated liver function tests.  EXAM: CT ABDOMEN AND PELVIS WITH CONTRAST  TECHNIQUE: Multidetector CT imaging of the abdomen and pelvis was performed using the standard protocol following bolus administration of intravenous contrast.  CONTRAST:  56m OMNIPAQUE IOHEXOL 300 MG/ML  SOLN  COMPARISON:  None.  FINDINGS: Lower chest: Scarring at the left lung base. Normal heart size without pericardial or pleural effusion. Probable left coronary artery atherosclerosis. Example image 1.  Hepatobiliary: Mild hepatic steatosis, without focal liver lesion. Gallbladder distention, 12.4 cm on sagittal image 38. Possible small gallstones or sludge dependently. No pericholecystic inflammation identified. No biliary ductal dilatation.  Pancreas: No focal pancreatic abnormality or ductal dilatation.  Spleen: Normal  Adrenals/Urinary Tract: Normal adrenal glands. Normal kidneys, without hydronephrosis. Normal urinary bladder.  Stomach/Bowel: Normal stomach, without wall thickening. Normal colon, appendix, and terminal ileum. Normal small bowel.  Vascular/Lymphatic: Normal caliber of the aorta and branch vessels. Advanced for age common iliac artery atherosclerosis bilaterally. No abdominopelvic adenopathy.  Reproductive: Normal prostate.  Other: Edema is identified adjacent to the descending duodenum, including on image 44 of series 2. Interstitial thickening extends throughout the right anterior pararenal space, mild. No ascites. No significant free fluid.  Musculoskeletal: No acute osseous abnormality. Disc bulges at L3-4, L4-5, and L5-S1  IMPRESSION: 1. Gallbladder distention with possible dependent sludge or stones. No specific evidence of acute cholecystitis. 2. Edema within the right anterior pararenal space, adjacent to the descending duodenum. Considerations include focal pancreatitis involving the head/uncinate  process, otherwise occult early acute cholecystitis, or duodenitis. Consider correlation with pancreatic enzyme levels and possibly right upper quadrant ultrasound. 3. Age advanced atherosclerosis. Suspect age advanced coronary artery disease. Correlate with risk factors and consider medical therapy.   Electronically Signed   By: KAbigail MiyamotoM.D.   On: 06/18/2014 20:14   Dg Chest Port 1 View  06/19/2014   CLINICAL DATA:  Gallstone pancreatitis.  EXAM: PORTABLE CHEST - 1 VIEW  COMPARISON:  None.  FINDINGS: There is elevation of right hemidiaphragm. Low lung volumes. The cardiomediastinal contours are normal. The lungs are clear. Pulmonary vasculature is normal. No consolidation, pleural effusion,  or pneumothorax. No acute osseous abnormalities are seen.  IMPRESSION: No acute pulmonary process.  Elevation of right hemidiaphragm.   Electronically Signed   By: Jeb Levering M.D.   On: 06/19/2014 01:59   Review of Systems  Constitutional: Positive for malaise/fatigue. Negative for fever, chills and weight loss.  HENT: Negative.   Eyes: Negative.   Respiratory: Negative.   Cardiovascular: Negative.   Gastrointestinal: Positive for nausea and abdominal pain. Negative for heartburn, vomiting, diarrhea, constipation, blood in stool and melena.  Genitourinary: Negative.   Musculoskeletal: Negative.   Skin: Negative.   Neurological: Positive for weakness.  Endo/Heme/Allergies: Negative.   Psychiatric/Behavioral: Negative.    Blood pressure 149/90, pulse 92, temperature 97.5 F (36.4 C), temperature source Oral, resp. rate 20, height '5\' 7"'  (1.702 m), weight 97.886 kg (215 lb 12.8 oz), SpO2 99 %. Physical Exam  Constitutional: He is oriented to person, place, and time. He appears well-developed and well-nourished.  HENT:  Head: Normocephalic and atraumatic.  Eyes: Conjunctivae and EOM are normal. Pupils are equal, round, and reactive to light.  Neck: Normal range of motion. Neck supple.   Cardiovascular: Normal rate and regular rhythm.   Respiratory: Effort normal and breath sounds normal.  GI: Soft. Bowel sounds are normal. He exhibits no distension and no mass. There is no tenderness. There is no rebound and no guarding.  Musculoskeletal: Normal range of motion.  Neurological: He is alert and oriented to person, place, and time.  Skin: Skin is warm and dry.  Psychiatric: He has a normal mood and affect. His behavior is normal. Judgment and thought content normal.   Assessment/Plan: 1) Gallstone pancreatitis; distended gallbladder with sludge vs stones-abnormal LFT'S; as there is no CBD dilation and the patient is completely asymptomatic at this time, it would be best to plan a cholecystectomy with an IOC once the acute pancreatitis resolves.  2) AODM  3) Hypertension. 4) Hyperlipidemia. 5) Age advanced atherosclerosis with suspected CAD.  Adam Baker 06/19/2014, 9:21 AM

## 2014-06-20 LAB — GLUCOSE, CAPILLARY
GLUCOSE-CAPILLARY: 144 mg/dL — AB (ref 70–99)
Glucose-Capillary: 133 mg/dL — ABNORMAL HIGH (ref 70–99)
Glucose-Capillary: 185 mg/dL — ABNORMAL HIGH (ref 70–99)
Glucose-Capillary: 203 mg/dL — ABNORMAL HIGH (ref 70–99)

## 2014-06-20 LAB — CBC
HEMATOCRIT: 40.5 % (ref 39.0–52.0)
HEMOGLOBIN: 13.5 g/dL (ref 13.0–17.0)
MCH: 27.8 pg (ref 26.0–34.0)
MCHC: 33.3 g/dL (ref 30.0–36.0)
MCV: 83.5 fL (ref 78.0–100.0)
Platelets: 164 10*3/uL (ref 150–400)
RBC: 4.85 MIL/uL (ref 4.22–5.81)
RDW: 13.8 % (ref 11.5–15.5)
WBC: 6 10*3/uL (ref 4.0–10.5)

## 2014-06-20 LAB — COMPREHENSIVE METABOLIC PANEL
ALT: 60 U/L — ABNORMAL HIGH (ref 0–53)
ANION GAP: 6 (ref 5–15)
AST: 26 U/L (ref 0–37)
Albumin: 3.1 g/dL — ABNORMAL LOW (ref 3.5–5.2)
Alkaline Phosphatase: 248 U/L — ABNORMAL HIGH (ref 39–117)
BUN: 7 mg/dL (ref 6–23)
CALCIUM: 8 mg/dL — AB (ref 8.4–10.5)
CO2: 30 mmol/L (ref 19–32)
Chloride: 105 mmol/L (ref 96–112)
Creatinine, Ser: 1.04 mg/dL (ref 0.50–1.35)
GFR calc Af Amer: >90 mL/min (ref >90)
GFR calc non Af Amer: 83 mL/min — ABNORMAL LOW (ref >90)
Glucose, Bld: 158 mg/dL — ABNORMAL HIGH (ref 70–99)
POTASSIUM: 3.8 mmol/L (ref 3.5–5.1)
SODIUM: 141 mmol/L (ref 135–145)
Total Bilirubin: 0.6 mg/dL (ref 0.3–1.2)
Total Protein: 5.9 g/dL — ABNORMAL LOW (ref 6.0–8.3)

## 2014-06-20 LAB — LIPASE, BLOOD: Lipase: 67 U/L — ABNORMAL HIGH (ref 11–59)

## 2014-06-20 NOTE — Progress Notes (Signed)
Subjective: No complaints  Objective: Vital signs in last 24 hours: Temp:  [97.5 F (36.4 C)-98.5 F (36.9 C)] 98.5 F (36.9 C) (02/07 0543) Pulse Rate:  [80-91] 80 (02/07 0543) Resp:  [18-20] 20 (02/07 0543) BP: (149-170)/(81-97) 150/86 mmHg (02/07 0543) SpO2:  [98 %-99 %] 99 % (02/07 0543) Weight:  [219 lb 1.6 oz (99.383 kg)] 219 lb 1.6 oz (99.383 kg) (02/07 0543) Last BM Date: 06/18/14  Intake/Output from previous day: 02/06 0701 - 02/07 0700 In: 3000 [I.V.:3000] Out: -  Intake/Output this shift:    Resp: clear to auscultation bilaterally Cardio: regular rate and rhythm GI: soft, nontender  Lab Results:   Recent Labs  06/19/14 0605 06/20/14 0547  WBC 7.2 6.0  HGB 14.0 13.5  HCT 41.8 40.5  PLT 162 164   BMET  Recent Labs  06/19/14 0605 06/20/14 0547  NA 137 141  K 3.6 3.8  CL 102 105  CO2 29 30  GLUCOSE 171* 158*  BUN 9 7  CREATININE 0.93 1.04  CALCIUM 7.9* 8.0*   PT/INR  Recent Labs  06/19/14 0605  LABPROT 13.8  INR 1.05   ABG No results for input(s): PHART, HCO3 in the last 72 hours.  Invalid input(s): PCO2, PO2  Studies/Results: Ct Abdomen Pelvis W Contrast  06/18/2014   CLINICAL DATA:  Initial encounter for upper abdominal pain. Nausea and vomiting. Elevated liver function tests.  EXAM: CT ABDOMEN AND PELVIS WITH CONTRAST  TECHNIQUE: Multidetector CT imaging of the abdomen and pelvis was performed using the standard protocol following bolus administration of intravenous contrast.  CONTRAST:  50mL OMNIPAQUE IOHEXOL 300 MG/ML  SOLN  COMPARISON:  None.  FINDINGS: Lower chest: Scarring at the left lung base. Normal heart size without pericardial or pleural effusion. Probable left coronary artery atherosclerosis. Example image 1.  Hepatobiliary: Mild hepatic steatosis, without focal liver lesion. Gallbladder distention, 12.4 cm on sagittal image 38. Possible small gallstones or sludge dependently. No pericholecystic inflammation identified. No  biliary ductal dilatation.  Pancreas: No focal pancreatic abnormality or ductal dilatation.  Spleen: Normal  Adrenals/Urinary Tract: Normal adrenal glands. Normal kidneys, without hydronephrosis. Normal urinary bladder.  Stomach/Bowel: Normal stomach, without wall thickening. Normal colon, appendix, and terminal ileum. Normal small bowel.  Vascular/Lymphatic: Normal caliber of the aorta and branch vessels. Advanced for age common iliac artery atherosclerosis bilaterally. No abdominopelvic adenopathy.  Reproductive: Normal prostate.  Other: Edema is identified adjacent to the descending duodenum, including on image 44 of series 2. Interstitial thickening extends throughout the right anterior pararenal space, mild. No ascites. No significant free fluid.  Musculoskeletal: No acute osseous abnormality. Disc bulges at L3-4, L4-5, and L5-S1  IMPRESSION: 1. Gallbladder distention with possible dependent sludge or stones. No specific evidence of acute cholecystitis. 2. Edema within the right anterior pararenal space, adjacent to the descending duodenum. Considerations include focal pancreatitis involving the head/uncinate process, otherwise occult early acute cholecystitis, or duodenitis. Consider correlation with pancreatic enzyme levels and possibly right upper quadrant ultrasound. 3. Age advanced atherosclerosis. Suspect age advanced coronary artery disease. Correlate with risk factors and consider medical therapy.   Electronically Signed   By: Kyle  Talbot M.D.   On: 06/18/2014 20:14   Dg Chest Port 1 View  06/19/2014   CLINICAL DATA:  Gallstone pancreatitis.  EXAM: PORTABLE CHEST - 1 VIEW  COMPARISON:  None.  FINDINGS: There is elevation of right hemidiaphragm. Low lung volumes. The cardiomediastinal contours are normal. The lungs are clear. Pulmonary vasculature is normal. No consolidation, pleural   effusion, or pneumothorax. No acute osseous abnormalities are seen.  IMPRESSION: No acute pulmonary process.  Elevation  of right hemidiaphragm.   Electronically Signed   By: Melanie  Ehinger M.D.   On: 06/19/2014 01:59    Anti-infectives: Anti-infectives    None      Assessment/Plan: s/p * No surgery found * Lipase still up slightly. will allow clears and plan for lap chole tomorrow  LOS: 2 days    TOTH III,Merrily Tegeler S 06/20/2014  

## 2014-06-20 NOTE — Progress Notes (Signed)
Cross cover [facility] Subjective: Patient denies having any abdominal pain, nausea or vomiting. Awaiting a lap chole tomorrow.   Objective: Vital signs in last 24 hours: Temp:  [97.5 F (36.4 C)-98.5 F (36.9 C)] 98.5 F (36.9 C) (02/07 0543) Pulse Rate:  [80-91] 80 (02/07 0543) Resp:  [18-20] 20 (02/07 0543) BP: (149-170)/(81-97) 150/86 mmHg (02/07 0543) SpO2:  [98 %-99 %] 99 % (02/07 0543) Weight:  [99.383 kg (219 lb 1.6 oz)] 99.383 kg (219 lb 1.6 oz) (02/07 0543) Last BM Date: 06/18/14  Intake/Output from previous day: 02/06 0701 - 02/07 0700 In: 3000 [I.V.:3000] Out: -  Intake/Output this shift: Total I/O In: 240 [P.O.:240] Out: -   General appearance: alert, cooperative, appears stated age and no distress Resp: clear to auscultation bilaterally Cardio: regular rate and rhythm, S1, S2 normal, no murmur, click, rub or gallop GI: soft, non-tender; bowel sounds normal; no masses,  no organomegaly Extremities: extremities normal, atraumatic, no cyanosis or edema  Lab Results:  Recent Labs  06/19/14 0605 06/20/14 0547  WBC 7.2 6.0  HGB 14.0 13.5  HCT 41.8 40.5  PLT 162 164   BMET  Recent Labs  06/19/14 0605 06/20/14 0547  NA 137 141  K 3.6 3.8  CL 102 105  CO2 29 30  GLUCOSE 171* 158*  BUN 9 7  CREATININE 0.93 1.04  CALCIUM 7.9* 8.0*   LFT  Recent Labs  06/20/14 0547  PROT 5.9*  ALBUMIN 3.1*  AST 26  ALT 60*  ALKPHOS 248*  BILITOT 0.6   PT/INR  Recent Labs  06/19/14 0605  LABPROT 13.8  INR 1.05   Studies/Results: Ct Abdomen Pelvis W Contrast  06/18/2014   CLINICAL DATA:  Initial encounter for upper abdominal pain. Nausea and vomiting. Elevated liver function tests.  EXAM: CT ABDOMEN AND PELVIS WITH CONTRAST  TECHNIQUE: Multidetector CT imaging of the abdomen and pelvis was performed using the standard protocol following bolus administration of intravenous contrast.  CONTRAST:  50mL OMNIPAQUE IOHEXOL 300 MG/ML  SOLN  COMPARISON:  None.   FINDINGS: Lower chest: Scarring at the left lung base. Normal heart size without pericardial or pleural effusion. Probable left coronary artery atherosclerosis. Example image 1.  Hepatobiliary: Mild hepatic steatosis, without focal liver lesion. Gallbladder distention, 12.4 cm on sagittal image 38. Possible small gallstones or sludge dependently. No pericholecystic inflammation identified. No biliary ductal dilatation.  Pancreas: No focal pancreatic abnormality or ductal dilatation.  Spleen: Normal  Adrenals/Urinary Tract: Normal adrenal glands. Normal kidneys, without hydronephrosis. Normal urinary bladder.  Stomach/Bowel: Normal stomach, without wall thickening. Normal colon, appendix, and terminal ileum. Normal small bowel.  Vascular/Lymphatic: Normal caliber of the aorta and branch vessels. Advanced for age common iliac artery atherosclerosis bilaterally. No abdominopelvic adenopathy.  Reproductive: Normal prostate.  Other: Edema is identified adjacent to the descending duodenum, including on image 44 of series 2. Interstitial thickening extends throughout the right anterior pararenal space, mild. No ascites. No significant free fluid.  Musculoskeletal: No acute osseous abnormality. Disc bulges at L3-4, L4-5, and L5-S1  IMPRESSION: 1. Gallbladder distention with possible dependent sludge or stones. No specific evidence of acute cholecystitis. 2. Edema within the right anterior pararenal space, adjacent to the descending duodenum. Considerations include focal pancreatitis involving the head/uncinate process, otherwise occult early acute cholecystitis, or duodenitis. Consider correlation with pancreatic enzyme levels and possibly right upper quadrant ultrasound. 3. Age advanced atherosclerosis. Suspect age advanced coronary artery disease. Correlate with risk factors and consider medical therapy.   Electronically Signed  By: Jeronimo Greaves M.D.   On: 06/18/2014 20:14   Dg Chest Port 1 View  06/19/2014   CLINICAL  DATA:  Gallstone pancreatitis.  EXAM: PORTABLE CHEST - 1 VIEW  COMPARISON:  None.  FINDINGS: There is elevation of right hemidiaphragm. Low lung volumes. The cardiomediastinal contours are normal. The lungs are clear. Pulmonary vasculature is normal. No consolidation, pleural effusion, or pneumothorax. No acute osseous abnormalities are seen.  IMPRESSION: No acute pulmonary process.  Elevation of right hemidiaphragm.   Electronically Signed   By: Rubye Oaks M.D.   On: 06/19/2014 01:59   Medications: I have reviewed the patient's current medications.  Assessment/Plan: 1) Gallstone pancreatitis with elevated LFT's: continue present care.   2) AODM/CAD/HTN/Hyperlipdemia.  LOS: 2 days   Adam Baker 06/20/2014, 11:33 AM

## 2014-06-20 NOTE — Progress Notes (Addendum)
Subjective: Admitted 2/5 c Gallstone Pancreatitis. He looks good today and is not in pain. Hungry. Appreciate GI and Gen Surg evals. Feeling fine no issues. No CP or SOB  Objective: Vital signs in last 24 hours: Temp:  [97.5 F (36.4 C)-98.5 F (36.9 C)] 98.5 F (36.9 C) (02/07 0543) Pulse Rate:  [80-91] 80 (02/07 0543) Resp:  [18-20] 20 (02/07 0543) BP: (149-170)/(81-97) 150/86 mmHg (02/07 0543) SpO2:  [98 %-99 %] 99 % (02/07 0543) Weight:  [99.383 kg (219 lb 1.6 oz)] 99.383 kg (219 lb 1.6 oz) (02/07 0543) Weight change: 1.633 kg (3 lb 9.6 oz) Last BM Date: 06/18/14  CBG (last 3)   Recent Labs  06/19/14 1225 06/19/14 1654 06/19/14 2113  GLUCAP 83 112* 221*    Intake/Output from previous day:  Intake/Output Summary (Last 24 hours) at 06/20/14 0750 Last data filed at 06/20/14 0600  Gross per 24 hour  Intake   3000 ml  Output      0 ml  Net   3000 ml   02/06 0701 - 02/07 0700 In: 3000 [I.V.:3000] Out: -    Physical Exam  General appearance: A and O Eyes: no scleral icterus Throat: oropharynx moist without erythema Resp: CTA B Cardio: Reg GI: soft, non-tender; bowel sounds normal; no masses, no organomegaly Extremities: no clubbing, cyanosis or edema   Lab Results:  Recent Labs  06/19/14 0605 06/20/14 0547  NA 137 141  K 3.6 3.8  CL 102 105  CO2 29 30  GLUCOSE 171* 158*  BUN 9 7  CREATININE 0.93 1.04  CALCIUM 7.9* 8.0*     Recent Labs  06/19/14 0605 06/20/14 0547  AST 67* 26  ALT 98* 60*  ALKPHOS 329* 248*  BILITOT 0.8 0.6  PROT 6.4 5.9*  ALBUMIN 3.2* 3.1*     Recent Labs  06/19/14 0605 06/20/14 0547  WBC 7.2 6.0  HGB 14.0 13.5  HCT 41.8 40.5  MCV 82.9 83.5  PLT 162 164    Lab Results  Component Value Date   INR 1.05 06/19/2014    No results for input(s): CKTOTAL, CKMB, CKMBINDEX, TROPONINI in the last 72 hours.  No results for input(s): TSH, T4TOTAL, T3FREE, THYROIDAB in the last 72 hours.  Invalid input(s):  FREET3  No results for input(s): VITAMINB12, FOLATE, FERRITIN, TIBC, IRON, RETICCTPCT in the last 72 hours.  Micro Results: No results found for this or any previous visit (from the past 240 hour(s)).   Studies/Results: Ct Abdomen Pelvis W Contrast  06/18/2014   CLINICAL DATA:  Initial encounter for upper abdominal pain. Nausea and vomiting. Elevated liver function tests.  EXAM: CT ABDOMEN AND PELVIS WITH CONTRAST  TECHNIQUE: Multidetector CT imaging of the abdomen and pelvis was performed using the standard protocol following bolus administration of intravenous contrast.  CONTRAST:  50mL OMNIPAQUE IOHEXOL 300 MG/ML  SOLN  COMPARISON:  None.  FINDINGS: Lower chest: Scarring at the left lung base. Normal heart size without pericardial or pleural effusion. Probable left coronary artery atherosclerosis. Example image 1.  Hepatobiliary: Mild hepatic steatosis, without focal liver lesion. Gallbladder distention, 12.4 cm on sagittal image 38. Possible small gallstones or sludge dependently. No pericholecystic inflammation identified. No biliary ductal dilatation.  Pancreas: No focal pancreatic abnormality or ductal dilatation.  Spleen: Normal  Adrenals/Urinary Tract: Normal adrenal glands. Normal kidneys, without hydronephrosis. Normal urinary bladder.  Stomach/Bowel: Normal stomach, without wall thickening. Normal colon, appendix, and terminal ileum. Normal small bowel.  Vascular/Lymphatic: Normal caliber of the aorta and  branch vessels. Advanced for age common iliac artery atherosclerosis bilaterally. No abdominopelvic adenopathy.  Reproductive: Normal prostate.  Other: Edema is identified adjacent to the descending duodenum, including on image 44 of series 2. Interstitial thickening extends throughout the right anterior pararenal space, mild. No ascites. No significant free fluid.  Musculoskeletal: No acute osseous abnormality. Disc bulges at L3-4, L4-5, and L5-S1  IMPRESSION: 1. Gallbladder distention with  possible dependent sludge or stones. No specific evidence of acute cholecystitis. 2. Edema within the right anterior pararenal space, adjacent to the descending duodenum. Considerations include focal pancreatitis involving the head/uncinate process, otherwise occult early acute cholecystitis, or duodenitis. Consider correlation with pancreatic enzyme levels and possibly right upper quadrant ultrasound. 3. Age advanced atherosclerosis. Suspect age advanced coronary artery disease. Correlate with risk factors and consider medical therapy.   Electronically Signed   By: Jeronimo Greaves M.D.   On: 06/18/2014 20:14   Dg Chest Port 1 View  06/19/2014   CLINICAL DATA:  Gallstone pancreatitis.  EXAM: PORTABLE CHEST - 1 VIEW  COMPARISON:  None.  FINDINGS: There is elevation of right hemidiaphragm. Low lung volumes. The cardiomediastinal contours are normal. The lungs are clear. Pulmonary vasculature is normal. No consolidation, pleural effusion, or pneumothorax. No acute osseous abnormalities are seen.  IMPRESSION: No acute pulmonary process.  Elevation of right hemidiaphragm.   Electronically Signed   By: Rubye Oaks M.D.   On: 06/19/2014 01:59     Medications: Scheduled: . heparin  5,000 Units Subcutaneous 3 times per day  . insulin aspart  0-15 Units Subcutaneous TID WC  . insulin aspart  0-5 Units Subcutaneous QHS  . insulin glargine  15 Units Subcutaneous QHS  . sodium chloride  3 mL Intravenous Q12H   Continuous: . sodium chloride 50 mL/hr at 06/19/14 1531  . dextrose 5 % and 0.9% NaCl 75 mL/hr at 06/20/14 0541     Assessment/Plan: Active Problems:   Gallstone pancreatitis   DM (diabetes mellitus), type 2, uncontrolled   Smoker   Essential hypertension   Hyperlipidemia   Coronary atherosclerosis  Gallstone Pancreatitis - For Gen Surgery c IOC soon. Less Nausea, less pain, and hungry - zofran prn/IVF/Pain control. No Clear Cholecystitis - Without fever/WBC count or issue on CT - Hold  Abx. LFTs and pancreatic Enzymes improving - AST down to 26.  lipase down to 67.  Very close to being ready for surgery  Hypertension - Hydral prn in house  Lisinopril 20 Mg Tabs (Lisinopril) .Marland Kitchen... Take 1 tablet by mouth every day as outpt  Nephropathy, diabetic c Microalb but nml Cr.  creat fine  CT also showed - Age advanced atherosclerosis. Suspect age advanced coronary artery disease. He was on ACEi, Atorvastatin and ASA. Cannot see EKG on EPIC - done in anticipation of upcoming surgery. Needs RF's dealt c aggressively Saw Cards- Dr Jacinto Halim- 9/3. Set up for Stress and ABIs. I discussed c Dr Jacinto Halim and he did place results in paper chart. Low risk study EF 50% c some inferior changes. Pt aware that tests unremarkable  Tobacco use - Needs to quit forever  Hyperlipidemia - restart Atorvastatin when taking POs.  TGs fine in office  DVT Proph - SQ Hep as can be held quicker for procedures.  Diabetes mellitus, type II, uncontrolled - Lantus and ISS in house  Hold Metformin. creat fine. Also on D5NS and NS for 125 cc per hr.  -   Recent Labs  06/19/14 1225 06/19/14 1654 06/19/14 2113  GLUCAP 83  112* 221*    LOS: 2 days   Daisuke Bailey M 06/20/2014, 7:50 AM

## 2014-06-21 ENCOUNTER — Inpatient Hospital Stay (HOSPITAL_COMMUNITY): Payer: 59

## 2014-06-21 ENCOUNTER — Inpatient Hospital Stay (HOSPITAL_COMMUNITY): Payer: 59 | Admitting: Anesthesiology

## 2014-06-21 ENCOUNTER — Encounter (HOSPITAL_COMMUNITY)
Admission: AD | Disposition: A | Discharge status: Home / Self Care | Payer: Self-pay | Source: Ambulatory Visit | Attending: Internal Medicine

## 2014-06-21 HISTORY — PX: CHOLECYSTECTOMY: SHX55

## 2014-06-21 LAB — GLUCOSE, CAPILLARY: GLUCOSE-CAPILLARY: 98 mg/dL (ref 70–99)

## 2014-06-21 LAB — SURGICAL PCR SCREEN
MRSA, PCR: NEGATIVE
STAPHYLOCOCCUS AUREUS: NEGATIVE

## 2014-06-21 LAB — COMPREHENSIVE METABOLIC PANEL
ALT: 64 U/L — ABNORMAL HIGH (ref 0–53)
AST: 22 U/L (ref 0–37)
Albumin: 3.1 g/dL — ABNORMAL LOW (ref 3.5–5.2)
Alkaline Phosphatase: 204 U/L — ABNORMAL HIGH (ref 39–117)
Anion gap: 5 (ref 5–15)
CHLORIDE: 104 mmol/L (ref 96–112)
CO2: 29 mmol/L (ref 19–32)
Calcium: 7.8 mg/dL — ABNORMAL LOW (ref 8.4–10.5)
Creatinine, Ser: 0.91 mg/dL (ref 0.50–1.35)
GFR calc non Af Amer: >90 mL/min (ref >90)
GLUCOSE: 112 mg/dL — AB (ref 70–99)
Potassium: 3.5 mmol/L (ref 3.5–5.1)
Sodium: 138 mmol/L (ref 135–145)
TOTAL PROTEIN: 5.8 g/dL — AB (ref 6.0–8.3)
Total Bilirubin: 0.7 mg/dL (ref 0.3–1.2)

## 2014-06-21 LAB — CBC
HEMATOCRIT: 39 % (ref 39.0–52.0)
HEMOGLOBIN: 13 g/dL (ref 13.0–17.0)
MCH: 28 pg (ref 26.0–34.0)
MCHC: 33.3 g/dL (ref 30.0–36.0)
MCV: 84.1 fL (ref 78.0–100.0)
Platelets: 166 10*3/uL (ref 150–400)
RBC: 4.64 MIL/uL (ref 4.22–5.81)
RDW: 14.1 % (ref 11.5–15.5)
WBC: 5.3 10*3/uL (ref 4.0–10.5)

## 2014-06-21 SURGERY — LAPAROSCOPIC CHOLECYSTECTOMY WITH INTRAOPERATIVE CHOLANGIOGRAM
Anesthesia: General | Site: Abdomen

## 2014-06-21 MED ORDER — LACTATED RINGERS IV SOLN
INTRAVENOUS | Status: DC
  Administered 2014-06-21: 1000 mL via INTRAVENOUS
  Administered 2014-06-21: 15:00:00 via INTRAVENOUS

## 2014-06-21 MED ORDER — FENTANYL CITRATE 0.05 MG/ML IJ SOLN
INTRAMUSCULAR | Status: AC
  Filled 2014-06-21: qty 5

## 2014-06-21 MED ORDER — PROMETHAZINE HCL 25 MG/ML IJ SOLN: 6.2500 mg | INTRAMUSCULAR | Status: DC | PRN

## 2014-06-21 MED ORDER — ROCURONIUM BROMIDE 100 MG/10ML IV SOLN
INTRAVENOUS | Status: DC | PRN
  Administered 2014-06-21: 30 mg via INTRAVENOUS

## 2014-06-21 MED ORDER — MIDAZOLAM HCL 2 MG/2ML IJ SOLN
INTRAMUSCULAR | Status: AC
  Filled 2014-06-21: qty 2

## 2014-06-21 MED ORDER — DEXAMETHASONE SODIUM PHOSPHATE 10 MG/ML IJ SOLN
INTRAMUSCULAR | Status: AC
  Filled 2014-06-21: qty 1

## 2014-06-21 MED ORDER — LACTATED RINGERS IV SOLN: INTRAVENOUS | Status: DC

## 2014-06-21 MED ORDER — PROPOFOL 10 MG/ML IV BOLUS
INTRAVENOUS | Status: DC | PRN
  Administered 2014-06-21: 150 mg via INTRAVENOUS
  Administered 2014-06-21: 50 mg via INTRAVENOUS

## 2014-06-21 MED ORDER — MEPERIDINE HCL 50 MG/ML IJ SOLN: 6.2500 mg | INTRAMUSCULAR | Status: DC | PRN

## 2014-06-21 MED ORDER — ONDANSETRON HCL 4 MG/2ML IJ SOLN
INTRAMUSCULAR | Status: AC
  Filled 2014-06-21: qty 2

## 2014-06-21 MED ORDER — SUCCINYLCHOLINE CHLORIDE 20 MG/ML IJ SOLN
INTRAMUSCULAR | Status: DC | PRN
  Administered 2014-06-21: 100 mg via INTRAVENOUS

## 2014-06-21 MED ORDER — CIPROFLOXACIN IN D5W 400 MG/200ML IV SOLN
400.0000 mg | Freq: Once | INTRAVENOUS | Status: AC
  Filled 2014-06-21: qty 200

## 2014-06-21 MED ORDER — ROCURONIUM BROMIDE 100 MG/10ML IV SOLN
INTRAVENOUS | Status: AC
  Filled 2014-06-21: qty 1

## 2014-06-21 MED ORDER — EPHEDRINE SULFATE 50 MG/ML IJ SOLN
INTRAMUSCULAR | Status: DC | PRN
  Administered 2014-06-21: 10 mg via INTRAVENOUS

## 2014-06-21 MED ORDER — ONDANSETRON HCL 4 MG/2ML IJ SOLN
INTRAMUSCULAR | Status: DC | PRN
  Administered 2014-06-21: 4 mg via INTRAVENOUS

## 2014-06-21 MED ORDER — LIDOCAINE HCL (CARDIAC) 20 MG/ML IV SOLN
INTRAVENOUS | Status: AC
  Filled 2014-06-21: qty 5

## 2014-06-21 MED ORDER — ATORVASTATIN CALCIUM 40 MG PO TABS
40.0000 mg | ORAL_TABLET | Freq: Every day | ORAL | Status: DC
  Administered 2014-06-21: 40 mg via ORAL
  Filled 2014-06-21: qty 1

## 2014-06-21 MED ORDER — NEOSTIGMINE METHYLSULFATE 10 MG/10ML IV SOLN
INTRAVENOUS | Status: DC | PRN
  Administered 2014-06-21: 4 mg via INTRAVENOUS

## 2014-06-21 MED ORDER — CIPROFLOXACIN IN D5W 400 MG/200ML IV SOLN
INTRAVENOUS | Status: DC | PRN
  Administered 2014-06-21: 400 mg via INTRAVENOUS

## 2014-06-21 MED ORDER — IOHEXOL 300 MG/ML  SOLN
INTRAMUSCULAR | Status: DC | PRN
  Administered 2014-06-21: 7 mL

## 2014-06-21 MED ORDER — SODIUM CHLORIDE 0.9 % IJ SOLN
INTRAMUSCULAR | Status: AC
  Filled 2014-06-21: qty 10

## 2014-06-21 MED ORDER — GLYCOPYRROLATE 0.2 MG/ML IJ SOLN
INTRAMUSCULAR | Status: DC | PRN
  Administered 2014-06-21: 0.6 mg via INTRAVENOUS

## 2014-06-21 MED ORDER — DEXAMETHASONE SODIUM PHOSPHATE 10 MG/ML IJ SOLN
INTRAMUSCULAR | Status: DC | PRN
  Administered 2014-06-21: 10 mg via INTRAVENOUS

## 2014-06-21 MED ORDER — MIDAZOLAM HCL 5 MG/5ML IJ SOLN
INTRAMUSCULAR | Status: DC | PRN
  Administered 2014-06-21: 2 mg via INTRAVENOUS

## 2014-06-21 MED ORDER — FENTANYL CITRATE 0.05 MG/ML IJ SOLN
INTRAMUSCULAR | Status: DC | PRN
  Administered 2014-06-21 (×3): 50 ug via INTRAVENOUS
  Administered 2014-06-21: 100 ug via INTRAVENOUS

## 2014-06-21 MED ORDER — CIPROFLOXACIN IN D5W 400 MG/200ML IV SOLN
INTRAVENOUS | Status: AC
  Filled 2014-06-21: qty 200

## 2014-06-21 MED ORDER — BUPIVACAINE-EPINEPHRINE 0.25% -1:200000 IJ SOLN
INTRAMUSCULAR | Status: DC | PRN
  Administered 2014-06-21: 17 mL

## 2014-06-21 MED ORDER — LACTATED RINGERS IV SOLN
INTRAVENOUS | Status: DC | PRN
  Administered 2014-06-21: 1000 mL

## 2014-06-21 MED ORDER — EPHEDRINE SULFATE 50 MG/ML IJ SOLN
INTRAMUSCULAR | Status: AC
  Filled 2014-06-21: qty 1

## 2014-06-21 MED ORDER — PROPOFOL 10 MG/ML IV BOLUS
INTRAVENOUS | Status: AC
  Filled 2014-06-21: qty 20

## 2014-06-21 MED ORDER — FENTANYL CITRATE 0.05 MG/ML IJ SOLN: 25.0000 ug | INTRAMUSCULAR | Status: DC | PRN

## 2014-06-21 MED ORDER — BUPIVACAINE-EPINEPHRINE 0.25% -1:200000 IJ SOLN
INTRAMUSCULAR | Status: AC
  Filled 2014-06-21: qty 1

## 2014-06-21 SURGICAL SUPPLY — 27 items
APPLIER CLIP 5 13 M/L LIGAMAX5 (MISCELLANEOUS) ×3
CATH REDDICK CHOLANGI 4FR 50CM (CATHETERS) ×3 IMPLANT
CHLORAPREP W/TINT 26ML (MISCELLANEOUS) ×3 IMPLANT
CLIP APPLIE 5 13 M/L LIGAMAX5 (MISCELLANEOUS) ×1 IMPLANT
COVER MAYO STAND STRL (DRAPES) ×3 IMPLANT
DECANTER SPIKE VIAL GLASS SM (MISCELLANEOUS) ×3 IMPLANT
DRAPE C-ARM 42X120 X-RAY (DRAPES) ×3 IMPLANT
DRAPE LAPAROSCOPIC ABDOMINAL (DRAPES) ×3 IMPLANT
ELECT REM PT RETURN 9FT ADLT (ELECTROSURGICAL) ×3
ELECTRODE REM PT RTRN 9FT ADLT (ELECTROSURGICAL) ×1 IMPLANT
GLOVE BIO SURGEON STRL SZ7.5 (GLOVE) ×6 IMPLANT
GOWN STRL REUS W/ TWL XL LVL3 (GOWN DISPOSABLE) IMPLANT
GOWN STRL REUS W/TWL LRG LVL3 (GOWN DISPOSABLE) ×3 IMPLANT
GOWN STRL REUS W/TWL XL LVL3 (GOWN DISPOSABLE) ×3 IMPLANT
HEMOSTAT SURGICEL 4X8 (HEMOSTASIS) IMPLANT
IV CATH 14GX2 1/4 (CATHETERS) ×3 IMPLANT
KIT BASIN OR (CUSTOM PROCEDURE TRAY) ×3 IMPLANT
LIQUID BAND (GAUZE/BANDAGES/DRESSINGS) ×3 IMPLANT
POUCH SPECIMEN RETRIEVAL 10MM (ENDOMECHANICALS) ×3 IMPLANT
SET IRRIG TUBING LAPAROSCOPIC (IRRIGATION / IRRIGATOR) ×3 IMPLANT
SLEEVE XCEL OPT CAN 5 100 (ENDOMECHANICALS) ×6 IMPLANT
SUT MNCRL AB 4-0 PS2 18 (SUTURE) ×3 IMPLANT
TOWEL OR 17X26 10 PK STRL BLUE (TOWEL DISPOSABLE) ×3 IMPLANT
TOWEL OR NON WOVEN STRL DISP B (DISPOSABLE) ×3 IMPLANT
TRAY LAPAROSCOPIC (CUSTOM PROCEDURE TRAY) ×3 IMPLANT
TROCAR BLADELESS OPT 5 100 (ENDOMECHANICALS) ×3 IMPLANT
TROCAR XCEL BLUNT TIP 100MML (ENDOMECHANICALS) ×3 IMPLANT

## 2014-06-21 NOTE — Care Management Note (Signed)
    Page 1 of 1   06/21/2014     3:38:59 PM CARE MANAGEMENT NOTE 06/21/2014  Patient:  Adam Baker,Adam Baker   Account Number:  [id number]  Date Initiated:  06/21/2014  Documentation initiated by:  Lanier ClamMAHABIR,Anastasha Ortez  Subjective/Objective Assessment:   49 y/o m admitted w/gallstone pancreatitis.     Action/Plan:   [place] in [place]Has pcp, pharmacy.   Anticipated DC Date:  06/25/2014   Anticipated DC Plan:  HOME/SELF CARE      DC Planning Services  CM consult      Choice offered to / List presented to:             Status of service:  In process, will continue to follow Medicare Important Message given?   (If response is "NO", the following Medicare IM given date fields will be blank) Date Medicare IM given:   Medicare IM given by:   Date Additional Medicare IM given:   Additional Medicare IM given by:    Discharge Disposition:    Per UR Regulation:  Reviewed for med. necessity/level of care/duration of stay  If discussed at Long Length of Stay Meetings, dates discussed:    Comments:  06/21/14 Lanier ClamKathy Cassia Fein RN BSN NCM [phone] Lap chole today.No anticipated d/c needs.

## 2014-06-21 NOTE — Transfer of Care (Signed)
Immediate Anesthesia Transfer of Care Note  Patient: Adam Baker  Procedure(s) Performed: Procedure(s): LAPAROSCOPIC CHOLECYSTECTOMY WITH INTRAOPERATIVE CHOLANGIOGRAM (N/A)  Patient Location: PACU  Anesthesia Type:General  Level of Consciousness: awake, alert , oriented and patient cooperative  Airway & Oxygen Therapy: Patient Spontanous Breathing and Patient connected to face mask oxygen  Post-op Assessment: Report given to RN and Post -op Vital signs reviewed and stable  Post vital signs: Reviewed and stable  Last Vitals:  Filed Vitals:   06/21/14 0816  BP: 169/105  Pulse: 80  Temp: 36.7 C  Resp: 18    Complications: No apparent anesthesia complications

## 2014-06-21 NOTE — H&P (View-Only) (Signed)
Subjective: No complaints  Objective: Vital signs in last 24 hours: Temp:  [97.5 F (36.4 C)-98.5 F (36.9 C)] 98.5 F (36.9 C) (02/07 0543) Pulse Rate:  [80-91] 80 (02/07 0543) Resp:  [18-20] 20 (02/07 0543) BP: (149-170)/(81-97) 150/86 mmHg (02/07 0543) SpO2:  [98 %-99 %] 99 % (02/07 0543) Weight:  [219 lb 1.6 oz (99.383 kg)] 219 lb 1.6 oz (99.383 kg) (02/07 0543) Last BM Date: 06/18/14  Intake/Output from previous day: 02/06 0701 - 02/07 0700 In: 3000 [I.V.:3000] Out: -  Intake/Output this shift:    Resp: clear to auscultation bilaterally Cardio: regular rate and rhythm GI: soft, nontender  Lab Results:   Recent Labs  06/19/14 0605 06/20/14 0547  WBC 7.2 6.0  HGB 14.0 13.5  HCT 41.8 40.5  PLT 162 164   BMET  Recent Labs  06/19/14 0605 06/20/14 0547  NA 137 141  K 3.6 3.8  CL 102 105  CO2 29 30  GLUCOSE 171* 158*  BUN 9 7  CREATININE 0.93 1.04  CALCIUM 7.9* 8.0*   PT/INR  Recent Labs  06/19/14 0605  LABPROT 13.8  INR 1.05   ABG No results for input(s): PHART, HCO3 in the last 72 hours.  Invalid input(s): PCO2, PO2  Studies/Results: Ct Abdomen Pelvis W Contrast  06/18/2014   CLINICAL DATA:  Initial encounter for upper abdominal pain. Nausea and vomiting. Elevated liver function tests.  EXAM: CT ABDOMEN AND PELVIS WITH CONTRAST  TECHNIQUE: Multidetector CT imaging of the abdomen and pelvis was performed using the standard protocol following bolus administration of intravenous contrast.  CONTRAST:  50mL OMNIPAQUE IOHEXOL 300 MG/ML  SOLN  COMPARISON:  None.  FINDINGS: Lower chest: Scarring at the left lung base. Normal heart size without pericardial or pleural effusion. Probable left coronary artery atherosclerosis. Example image 1.  Hepatobiliary: Mild hepatic steatosis, without focal liver lesion. Gallbladder distention, 12.4 cm on sagittal image 38. Possible small gallstones or sludge dependently. No pericholecystic inflammation identified. No  biliary ductal dilatation.  Pancreas: No focal pancreatic abnormality or ductal dilatation.  Spleen: Normal  Adrenals/Urinary Tract: Normal adrenal glands. Normal kidneys, without hydronephrosis. Normal urinary bladder.  Stomach/Bowel: Normal stomach, without wall thickening. Normal colon, appendix, and terminal ileum. Normal small bowel.  Vascular/Lymphatic: Normal caliber of the aorta and branch vessels. Advanced for age common iliac artery atherosclerosis bilaterally. No abdominopelvic adenopathy.  Reproductive: Normal prostate.  Other: Edema is identified adjacent to the descending duodenum, including on image 44 of series 2. Interstitial thickening extends throughout the right anterior pararenal space, mild. No ascites. No significant free fluid.  Musculoskeletal: No acute osseous abnormality. Disc bulges at L3-4, L4-5, and L5-S1  IMPRESSION: 1. Gallbladder distention with possible dependent sludge or stones. No specific evidence of acute cholecystitis. 2. Edema within the right anterior pararenal space, adjacent to the descending duodenum. Considerations include focal pancreatitis involving the head/uncinate process, otherwise occult early acute cholecystitis, or duodenitis. Consider correlation with pancreatic enzyme levels and possibly right upper quadrant ultrasound. 3. Age advanced atherosclerosis. Suspect age advanced coronary artery disease. Correlate with risk factors and consider medical therapy.   Electronically Signed   By: Jeronimo GreavesKyle  Talbot M.D.   On: 06/18/2014 20:14   Dg Chest Port 1 View  06/19/2014   CLINICAL DATA:  Gallstone pancreatitis.  EXAM: PORTABLE CHEST - 1 VIEW  COMPARISON:  None.  FINDINGS: There is elevation of right hemidiaphragm. Low lung volumes. The cardiomediastinal contours are normal. The lungs are clear. Pulmonary vasculature is normal. No consolidation, pleural  effusion, or pneumothorax. No acute osseous abnormalities are seen.  IMPRESSION: No acute pulmonary process.  Elevation  of right hemidiaphragm.   Electronically Signed   By: Rubye Oaks M.D.   On: 06/19/2014 01:59    Anti-infectives: Anti-infectives    None      Assessment/Plan: s/p * No surgery found * Lipase still up slightly. will allow clears and plan for lap chole tomorrow  LOS: 2 days    TOTH III,Anea Fodera S 06/20/2014

## 2014-06-21 NOTE — Progress Notes (Signed)
Subjective: Admitted 2/5 c Gallstone Pancreatitis. No complaints of N/V.  Liquid diet tolerated w/o issue. Loose stools as a result  No abd pain  Objective: Vital signs in last 24 hours: Temp:  [97.6 F (36.4 C)-98.3 F (36.8 C)] 98.2 F (36.8 C) (02/08 0419) Pulse Rate:  [81-87] 81 (02/08 0419) Resp:  [18-20] 18 (02/08 0419) BP: (145-165)/(83-95) 145/83 mmHg (02/08 0419) SpO2:  [98 %-100 %] 100 % (02/08 0419) Weight:  [216 lb 14.4 oz (98.385 kg)] 216 lb 14.4 oz (98.385 kg) (02/08 0419) Weight change: -2 lb 3.2 oz (-0.998 kg) Last BM Date: 06/20/14  CBG (last 3)   Recent Labs  06/20/14 1142 06/20/14 1653 06/20/14 2111  GLUCAP 185* 203* 144*    Intake/Output from previous day:  Intake/Output Summary (Last 24 hours) at 06/21/14 0726 Last data filed at 06/21/14 0000  Gross per 24 hour  Intake   3210 ml  Output      0 ml  Net   3210 ml   02/07 0701 - 02/08 0700 In: 3210 [P.O.:960; I.V.:2250] Out: -    Physical Exam  General appearance: A and O. NAD  Eyes: no scleral icterus Throat: oropharynx moist without erythema Resp: CTA B, no wr  Cardio: RRR, no mrg  GI: soft, non-tender; bowel sounds normal; no masses, no organomegaly Extremities: no clubbing, cyanosis or edema   Lab Results:  Recent Labs  06/20/14 0547 06/21/14 0520  NA 141 138  K 3.8 3.5  CL 105 104  CO2 30 29  GLUCOSE 158* 112*  BUN 7 <5*  CREATININE 1.04 0.91  CALCIUM 8.0* 7.8*     Recent Labs  06/20/14 0547 06/21/14 0520  AST 26 22  ALT 60* 64*  ALKPHOS 248* 204*  BILITOT 0.6 0.7  PROT 5.9* 5.8*  ALBUMIN 3.1* 3.1*     Recent Labs  06/20/14 0547 06/21/14 0520  WBC 6.0 5.3  HGB 13.5 13.0  HCT 40.5 39.0  MCV 83.5 84.1  PLT 164 166    Lab Results  Component Value Date   INR 1.05 06/19/2014    No results for input(s): CKTOTAL, CKMB, CKMBINDEX, TROPONINI in the last 72 hours.  No results for input(s): TSH, T4TOTAL, T3FREE, THYROIDAB in the last 72  hours.  Invalid input(s): FREET3  No results for input(s): VITAMINB12, FOLATE, FERRITIN, TIBC, IRON, RETICCTPCT in the last 72 hours.  Micro Results: No results found for this or any previous visit (from the past 240 hour(s)).   Studies/Results: No results found.   Medications: Scheduled: . heparin  5,000 Units Subcutaneous 3 times per day  . insulin aspart  0-15 Units Subcutaneous TID WC  . insulin aspart  0-5 Units Subcutaneous QHS  . insulin glargine  15 Units Subcutaneous QHS  . sodium chloride  3 mL Intravenous Q12H   Continuous: . sodium chloride 50 mL/hr at 06/21/14 0717  . dextrose 5 % and 0.9% NaCl 75 mL/hr at 06/21/14 0419     Assessment/Plan: Active Problems:   Gallstone pancreatitis   DM (diabetes mellitus), type 2, uncontrolled   Smoker   Essential hypertension   Hyperlipidemia   Coronary atherosclerosis  Gallstone Pancreatitis - per gen surg, for lap choley today. on D5NS and NS for 125 cc per hr.  Hypertension - holding ACE perioperatively w/ moderate control. Resume postop   Age advanced atherosclerosis - per CT scan . Resume  ACEi, Atorvastatin and ASA postop once LFTs normalize   Tobacco use - Needs to quit forever.  counceled   Hyperlipidemia - restart Atorvastatin when LFTs normalize postop  DVT Proph - SQ Hep as can be held quicker for procedures.  Diabetes mellitus, type II, uncontrolled - Lantus and ISS in house  Hold Metformin.  -   Recent Labs  06/20/14 1142 06/20/14 1653 06/20/14 2111  GLUCAP 185* 203* 144*    LOS: 3 days   Adam Baker 06/21/2014, 7:26 AM

## 2014-06-21 NOTE — Anesthesia Postprocedure Evaluation (Signed)
Anesthesia Post Note  Patient: Adam Baker  Procedure(s) Performed: Procedure(s) (LRB): LAPAROSCOPIC CHOLECYSTECTOMY WITH INTRAOPERATIVE CHOLANGIOGRAM (N/A)  Anesthesia type: general  Patient location: PACU  Post pain: Pain level controlled  Post assessment: Patient's Cardiovascular Status Stable  Last Vitals:  Filed Vitals:   06/21/14 1627  BP: 178/91  Pulse: 82  Temp: 36.4 C  Resp: 16    Post vital signs: Reviewed and stable  Level of consciousness: sedated  Complications: No apparent anesthesia complications

## 2014-06-21 NOTE — Anesthesia Preprocedure Evaluation (Addendum)
Anesthesia Evaluation    Airway Mallampati: II  TM Distance: >3 FB Neck ROM: Full    Dental no notable dental hx.    Pulmonary Current Smoker,  breath sounds clear to auscultation  Pulmonary exam normal       Cardiovascular hypertension, - CAD (pt denies) Rhythm:Regular Rate:Normal     Neuro/Psych    GI/Hepatic   Endo/Other  diabetes, Type 2, Oral Hypoglycemic Agents  Renal/GU      Musculoskeletal   Abdominal   Peds  Hematology   Anesthesia Other Findings   Reproductive/Obstetrics                            Anesthesia Physical Anesthesia Plan  ASA: II  Anesthesia Plan: General   Post-op Pain Management:    Induction: Intravenous  Airway Management Planned: Oral ETT  Additional Equipment:   Intra-op Plan:   Post-operative Plan: Extubation in OR  Informed Consent: I have reviewed the patients History and Physical, chart, labs and discussed the procedure including the risks, benefits and alternatives for the proposed anesthesia with the patient or authorized representative who has indicated his/her understanding and acceptance.   Dental advisory given  Plan Discussed with: CRNA  Anesthesia Plan Comments:         Anesthesia Quick Evaluation

## 2014-06-21 NOTE — Interval H&P Note (Signed)
History and Physical Interval Note:  06/21/2014 1:37 PM  Adam Baker  has presented today for surgery, with the diagnosis of CHOLECYSTITIS  The various methods of treatment have been discussed with the patient and family. After consideration of risks, benefits and other options for treatment, the patient has consented to  Procedure(s): LAPAROSCOPIC CHOLECYSTECTOMY WITH INTRAOPERATIVE CHOLANGIOGRAM (N/A) as a surgical intervention .  The patient's history has been reviewed, patient examined, no change in status, stable for surgery.  I have reviewed the patient's chart and labs.  Questions were answered to the patient's satisfaction.     TOTH III,[name] S

## 2014-06-21 NOTE — Op Note (Signed)
06/18/2014 - 06/21/2014  3:37 PM  PATIENT:  Adam Baker  49 y.o. male  PRE-OPERATIVE DIAGNOSIS:  Gallstone pancreatitis  POST-OPERATIVE DIAGNOSIS:  Gallstone pancreatitis  PROCEDURE:  Procedure(s): LAPAROSCOPIC CHOLECYSTECTOMY WITH INTRAOPERATIVE CHOLANGIOGRAM (N/A)  SURGEON:  Surgeon(s) and Role:    * Griselda Miner, MD - Primary    * Ovidio Kin, MD - Assisting  PHYSICIAN ASSISTANT:   ASSISTANTS: Dr. Ezzard Standing   ANESTHESIA:   general  EBL:  Total I/O In: 1985.3 [I.V.:1985.3] Out: 25 [Blood:25]  BLOOD ADMINISTERED:none  DRAINS: none   LOCAL MEDICATIONS USED:  MARCAINE     SPECIMEN:  Source of Specimen:  gallbladder  DISPOSITION OF SPECIMEN:  PATHOLOGY  COUNTS:  YES  TOURNIQUET:  * No tourniquets in log *  DICTATION: .Dragon Dictation   Procedure: After informed consent was obtained the patient was brought to the operating room and placed in the supine position on the operating room table. After adequate induction of general anesthesia the patient's abdomen was prepped with ChloraPrep allowed to dry and draped in usual sterile manner. The area below the umbilicus was infiltrated with quarter percent  Marcaine. A small incision was made with a 15 blade knife. The incision was carried down through the subcutaneous tissue bluntly with a hemostat and [employer] retractors. The linea alba was identified. The linea alba was incised with a 15 blade knife and each side was grasped with Coker clamps. The preperitoneal space was then probed with a hemostat until the peritoneum was opened and access was gained to the abdominal cavity. A 0 Vicryl pursestring stitch was placed in the fascia surrounding the opening. A Hassan cannula was then placed through the opening and anchored in place with the previously placed Vicryl purse string stitch. The abdomen was insufflated with carbon dioxide without difficulty. A laparoscope was inserted through the [facility] cannula in the right upper quadrant was  inspected. Next the epigastric region was infiltrated with % Marcaine. A small incision was made with a 15 blade knife. A 5 mm port was placed bluntly through this incision into the abdominal cavity under direct vision. Next 2 sites were chosen laterally on the right side of the abdomen for placement of 5 mm ports. Each of these areas was infiltrated with quarter percent Marcaine. Small stab incisions were made with a 15 blade knife. 5 mm ports were then placed bluntly through these incisions into the abdominal cavity under direct vision without difficulty. A blunt grasper was placed through the lateralmost 5 mm port and used to grasp the dome of the gallbladder and elevated anteriorly and superiorly. Another blunt grasper was placed through the other 5 mm port and used to retract the body and neck of the gallbladder. A dissector was placed through the epigastric port and using the electrocautery the peritoneal reflection at the gallbladder neck was opened. Blunt dissection was then carried out in this area until the gallbladder neck-cystic duct junction was readily identified and a good window was created. A single clip was placed on the gallbladder neck. A small  ductotomy was made just below the clip with laparoscopic scissors. A 14-gauge Angiocath was then placed through the anterior abdominal wall under direct vision. A Reddick cholangiogram catheter was then placed through the Angiocath and flushed. The catheter was then placed in the cystic duct and anchored in place with a clip. A cholangiogram was obtained that showed no filling defects good emptying into the duodenum an adequate length on the cystic duct. The anchoring clip and  catheters were then removed from the patient. 3 clips were placed proximally on the cystic duct and the duct was divided between the 2 sets of clips. Posterior to this the cystic artery was identified and again dissected bluntly in a circumferential manner until a good window  was  created. 2 clips were placed proximally and one distally on the artery and the artery was divided between the 2 sets of clips. Next a laparoscopic hook cautery device was used to separate the gallbladder from the liver bed. Prior to completely detaching the gallbladder from the liver bed the liver bed was inspected and several small bleeding points were coagulated with the electrocautery until the area was completely hemostatic. The gallbladder was then detached the rest of it from the liver bed without difficulty. A laparoscopic bag was inserted through the hassan port. The gallbladder was placed within the bag and the bag was sealed. A laparoscope was moved to the epigastric port. . The bag with the gallbladder was then removed with the [facility] cannula through the infraumbilical port without difficulty. The fascial defect was then closed with the previously placed Vicryl pursestring stitch as well as with another figure-of-eight 0 Vicryl stitch. The liver bed was inspected again and found to be hemostatic. The abdomen was irrigated with copious amounts of saline until the effluent was clear. The ports were then removed under direct vision without difficulty and were found to be hemostatic. The gas was allowed to escape. The skin incisions were all closed with interrupted 4-0 Monocryl subcuticular stitches. Dermabond dressings were applied. The patient tolerated the procedure well. At the end of the case all needle sponge and instrument counts were correct. The patient was then awakened and taken to recovery in stable condition  PLAN OF CARE: Admit for overnight observation  PATIENT DISPOSITION:  PACU - hemodynamically stable.   Delay start of Pharmacological VTE agent (>24hrs) due to surgical blood loss or risk of bleeding: yes

## 2014-06-21 NOTE — Progress Notes (Signed)
Day of Surgery  Subjective: Alert, no pain , waiting for surgery. Objective: Vital signs in last 24 hours: Temp:  [97.6 F (36.4 C)-98.3 F (36.8 C)] 98 F (36.7 C) (02/08 0816) Pulse Rate:  [80-87] 80 (02/08 0816) Resp:  [18-20] 18 (02/08 0816) BP: (145-169)/(83-105) 169/105 mmHg (02/08 0816) SpO2:  [98 %-100 %] 98 % (02/08 0816) Weight:  [98.385 kg (216 lb 14.4 oz)] 98.385 kg (216 lb 14.4 oz) (02/08 0419) Last BM Date: 06/20/14 960 PO yesterday NPO for surgery today Afebrile, VSS Labs OK Intake/Output from previous day: 02/07 0701 - 02/08 0700 In: 3210 [P.O.:960; I.V.:2250] Out: -  Intake/Output this shift:    General appearance: alert, cooperative and no distress GI: soft, non-tender; bowel sounds normal; no masses,  no organomegaly  Lab Results:   Recent Labs  06/20/14 0547 06/21/14 0520  WBC 6.0 5.3  HGB 13.5 13.0  HCT 40.5 39.0  PLT 164 166    BMET  Recent Labs  06/20/14 0547 06/21/14 0520  NA 141 138  K 3.8 3.5  CL 105 104  CO2 30 29  GLUCOSE 158* 112*  BUN 7 <5*  CREATININE 1.04 0.91  CALCIUM 8.0* 7.8*   PT/INR  Recent Labs  06/19/14 0605  LABPROT 13.8  INR 1.05     Recent Labs Lab 06/19/14 0605 06/20/14 0547 06/21/14 0520  AST 67* 26 22  ALT 98* 60* 64*  ALKPHOS 329* 248* 204*  BILITOT 0.8 0.6 0.7  PROT 6.4 5.9* 5.8*  ALBUMIN 3.2* 3.1* 3.1*     Lipase     Component Value Date/Time   LIPASE 67* 06/20/2014 0547     Studies/Results: No results found.  Medications: . heparin  5,000 Units Subcutaneous 3 times per day  . insulin aspart  0-15 Units Subcutaneous TID WC  . insulin aspart  0-5 Units Subcutaneous QHS  . insulin glargine  15 Units Subcutaneous QHS  . sodium chloride  3 mL Intravenous Q12H    Assessment/Plan Gallstone Pancreatitis AODM Hypertension Tobacco use CAD Dyslipidemia    For OR later today.      LOS: 3 days    Adam Baker 06/21/2014

## 2014-06-22 ENCOUNTER — Encounter: Payer: Self-pay | Admitting: General Surgery

## 2014-06-22 ENCOUNTER — Encounter (HOSPITAL_COMMUNITY): Payer: Self-pay | Admitting: General Surgery

## 2014-06-22 LAB — CBC
HCT: 44.9 % (ref 39.0–52.0)
HEMOGLOBIN: 15.3 g/dL (ref 13.0–17.0)
MCH: 28.7 pg (ref 26.0–34.0)
MCHC: 34.1 g/dL (ref 30.0–36.0)
MCV: 84.2 fL (ref 78.0–100.0)
Platelets: 164 10*3/uL (ref 150–400)
RBC: 5.33 MIL/uL (ref 4.22–5.81)
RDW: 14 % (ref 11.5–15.5)
WBC: 11.5 10*3/uL — ABNORMAL HIGH (ref 4.0–10.5)

## 2014-06-22 LAB — GLUCOSE, CAPILLARY
GLUCOSE-CAPILLARY: 206 mg/dL — AB (ref 70–99)
GLUCOSE-CAPILLARY: 221 mg/dL — AB (ref 70–99)
GLUCOSE-CAPILLARY: 203 mg/dL — AB (ref 70–99)
Glucose-Capillary: 123 mg/dL — ABNORMAL HIGH (ref 70–99)
Glucose-Capillary: 165 mg/dL — ABNORMAL HIGH (ref 70–99)

## 2014-06-22 LAB — COMPREHENSIVE METABOLIC PANEL
ALT: 96 U/L — ABNORMAL HIGH (ref 0–53)
AST: 75 U/L — ABNORMAL HIGH (ref 0–37)
Albumin: 3.3 g/dL — ABNORMAL LOW (ref 3.5–5.2)
Alkaline Phosphatase: 225 U/L — ABNORMAL HIGH (ref 39–117)
Anion gap: 10 (ref 5–15)
BUN: 9 mg/dL (ref 6–23)
CALCIUM: 8.5 mg/dL (ref 8.4–10.5)
CO2: 26 mmol/L (ref 19–32)
Chloride: 101 mmol/L (ref 96–112)
Creatinine, Ser: 1.24 mg/dL (ref 0.50–1.35)
GFR calc non Af Amer: 67 mL/min — ABNORMAL LOW (ref >90)
GFR, EST AFRICAN AMERICAN: 78 mL/min — AB (ref >90)
Glucose, Bld: 301 mg/dL — ABNORMAL HIGH (ref 70–99)
Potassium: 4.4 mmol/L (ref 3.5–5.1)
Sodium: 137 mmol/L (ref 135–145)
TOTAL PROTEIN: 6.9 g/dL (ref 6.0–8.3)
Total Bilirubin: 0.6 mg/dL (ref 0.3–1.2)

## 2014-06-22 MED ORDER — HYDROCODONE-ACETAMINOPHEN 5-325 MG PO TABS: 1.0000 | ORAL_TABLET | Freq: Four times a day (QID) | ORAL | Status: DC | PRN

## 2014-06-22 MED ORDER — LISINOPRIL 20 MG PO TABS
20.0000 mg | ORAL_TABLET | Freq: Every day | ORAL | Status: DC
Start: 2014-06-22 — End: 2015-04-25

## 2014-06-22 MED ORDER — INSULIN REGULAR HUMAN 100 UNIT/ML IJ SOLN: INTRAMUSCULAR | Status: DC

## 2014-06-22 MED ORDER — ONDANSETRON HCL 4 MG PO TABS: 4.0000 mg | ORAL_TABLET | Freq: Four times a day (QID) | ORAL | Status: DC | PRN

## 2014-06-22 MED ORDER — INSULIN NPH (HUMAN) (ISOPHANE) 100 UNIT/ML ~~LOC~~ SUSP: SUBCUTANEOUS | Status: DC

## 2014-06-22 MED ORDER — OXYCODONE-ACETAMINOPHEN 5-325 MG PO TABS: 1.0000 | ORAL_TABLET | ORAL | Status: DC | PRN

## 2014-06-22 MED ORDER — ASPIRIN EC 81 MG PO TBEC
81.0000 mg | DELAYED_RELEASE_TABLET | Freq: Every day | ORAL | Status: DC
  Administered 2014-06-22: 81 mg via ORAL
  Filled 2014-06-22: qty 1

## 2014-06-22 MED ORDER — LISINOPRIL 20 MG PO TABS
20.0000 mg | ORAL_TABLET | Freq: Every day | ORAL | Status: DC
  Administered 2014-06-22: 20 mg via ORAL
  Filled 2014-06-22: qty 1

## 2014-06-22 MED ORDER — METFORMIN HCL 1000 MG PO TABS: ORAL_TABLET | ORAL | Status: DC

## 2014-06-22 NOTE — Discharge Instructions (Signed)
Laparoscopic Cholecystectomy, Care After °Refer to this sheet in the next few weeks. These instructions provide you with information on caring for yourself after your procedure. Your health care provider may also give you more specific instructions. Your treatment has been planned according to current medical practices, but problems sometimes occur. Call your health care provider if you have any problems or questions after your procedure. °WHAT TO EXPECT AFTER THE PROCEDURE °After your procedure, it is typical to have the following: °· Pain at your incision sites. You will be given pain medicines to control the pain. °· Mild nausea or vomiting. This should improve after the first 24 hours. °· Bloating and possibly shoulder pain from the gas used during the procedure. This will improve after the first 24 hours. °HOME CARE INSTRUCTIONS  °· Change bandages (dressings) as directed by your health care provider. °· Keep the wound dry and clean. You may wash the wound gently with soap and water. Gently blot or dab the area dry. °· Do not take baths or use swimming pools or hot tubs for 2 weeks or until your health care provider approves. °· Only take over-the-counter or prescription medicines as directed by your health care provider. °· Continue your normal diet as directed by your health care provider. °· Do not lift anything heavier than 10 pounds (4.5 kg) until your health care provider approves. °· Do not play contact sports for 1 week or until your health care provider approves. °SEEK MEDICAL CARE IF:  °· You have redness, swelling, or increasing pain in the wound. °· You notice yellowish-white fluid (pus) coming from the wound. °· You have drainage from the wound that lasts longer than 1 day. °· You notice a bad smell coming from the wound or dressing. °· Your surgical cuts (incisions) break open. °SEEK IMMEDIATE MEDICAL CARE IF:  °· You develop a rash. °· You have difficulty breathing. °· You have chest pain. °· You  have a fever. °· You have increasing pain in the shoulders (shoulder strap areas). °· You have dizzy episodes or faint while standing. °· You have severe abdominal pain. °· You feel sick to your stomach (nauseous) or throw up (vomit) and this lasts for more than 1 day. °Document Released: 04/30/2005 Document Revised: 02/18/2013 Document Reviewed: 12/10/2012 °ExitCare® Patient Information ©2015 [employer]. This information is not intended to replace advice given to you by your health care provider. Make sure you discuss any questions you have with your health care provider. ° °[facility] ______[facility] °LAPAROSCOPIC SURGERY: POST OP INSTRUCTIONS °Always review your discharge instruction sheet given to you by the facility where your surgery was performed. °IF YOU HAVE DISABILITY OR FAMILY LEAVE FORMS, YOU MUST BRING THEM TO THE OFFICE FOR PROCESSING.   °DO NOT GIVE THEM TO YOUR DOCTOR. ° °1. A prescription for pain medication may be given to you upon discharge.  Take your pain medication as prescribed, if needed.  If narcotic pain medicine is not needed, then you may take acetaminophen (Tylenol) or ibuprofen (Advil) as needed. °2. Take your usually prescribed medications unless otherwise directed. °3. If you need a refill on your pain medication, please contact your pharmacy.  They will contact our office to request authorization. Prescriptions will not be filled after 5pm or on week-ends. °4. You should follow a light diet the first few days after arrival home, such as soup and crackers, etc.  Be sure to include lots of fluids daily. °5. Most patients will experience some   swelling and bruising in the area of the incisions.  Ice packs will help.  Swelling and bruising can take several days to resolve.  °6. It is common to experience some constipation if taking pain medication after surgery.  Increasing fluid intake and taking a stool softener (such as Colace) will usually help or prevent this problem  from occurring.  A mild laxative (Milk of Magnesia or Miralax) should be taken according to package instructions if there are no bowel movements after 48 hours. °7. Unless discharge instructions indicate otherwise, you may remove your bandages 24-48 hours after surgery, and you may shower at that time.  You may have steri-strips (small skin tapes) in place directly over the incision.  These strips should be left on the skin for 7-10 days.  If your surgeon used skin glue on the incision, you may shower in 24 hours.  The glue will flake off over the next 2-3 weeks.  Any sutures or staples will be removed at the office during your follow-up visit. °8. ACTIVITIES:  You may resume regular (light) daily activities beginning the next day--such as daily self-care, walking, climbing stairs--gradually increasing activities as tolerated.  You may have sexual intercourse when it is comfortable.  Refrain from any heavy lifting or straining until approved by your doctor. °a. You may drive when you are no longer taking prescription pain medication, you can comfortably wear a seatbelt, and you can safely maneuver your car and apply brakes. °b. RETURN TO WORK:  __________________________________________________________ °9. You should see your doctor in the office for a follow-up appointment approximately 2-3 weeks after your surgery.  Make sure that you call for this appointment within a day or two after you arrive home to insure a convenient appointment time. °10. OTHER INSTRUCTIONS: __________________________________________________________________________________________________________________________ __________________________________________________________________________________________________________________________ °WHEN TO CALL YOUR DOCTOR: °1. Fever over 101.0 °2. Inability to urinate °3. Continued bleeding from incision. °4. Increased pain, redness, or drainage from the incision. °5. Increasing abdominal pain ° °The  clinic staff is available to answer your questions during regular business hours.  Please don’t hesitate to call and ask to speak to one of the nurses for clinical concerns.  If you have a medical emergency, go to the nearest emergency room or call 911.  A surgeon from [facility] is always on call at the hospital. °[address], [address], [place], [place]  [address] ? [address] [address], [place], [place]   [address] °[phone] ? [phone] ? FAX [phone] °Web site: [email or web address] °

## 2014-06-22 NOTE — Discharge Summary (Addendum)
Physician Discharge Summary    [name]  MR#: [id number]  DOB:07/13/1965  Date of Admission: 06/18/2014 Date of Discharge: 06/22/2014  Attending Physician:Randeep Biondolillo  Patient's Adam Baker, Adam Picket, MD  Consults:Treatment Team:  Pamella Pert, MD Charna Elizabeth, MD Md [facility], MD    Discharge Diagnoses: Active Problems:   Gallstone pancreatitis   DM (diabetes mellitus), type 2, uncontrolled   Smoker   Essential hypertension   Hyperlipidemia   Coronary atherosclerosis   Discharge Medications:   Medication List    TAKE these medications        aspirin EC 81 MG tablet  Take 81 mg by mouth daily.     atorvastatin 40 MG tablet  Commonly known as:  LIPITOR  Take 40 mg by mouth at bedtime.     HYDROcodone-acetaminophen 5-325 MG per tablet  Commonly known as:  NORCO  Take 1 tablet by mouth every 6 (six) hours as needed for moderate pain.     insulin NPH Human 100 UNIT/ML injection  Commonly known as:  NOVOLIN N  Inject 18U in qAM and 20U qPM     insulin regular 100 units/mL injection  Commonly known as:  NOVOLIN R  Inject 7U before lunch     lisinopril 20 MG tablet  Commonly known as:  PRINIVIL,ZESTRIL  Take 1 tablet (20 mg total) by mouth daily.     metFORMIN 1000 MG tablet  Commonly known as:  GLUCOPHAGE  Take 1 tab bid starting with first dosage on evening of 2/10     ondansetron 4 MG tablet  Commonly known as:  ZOFRAN  Take 1 tablet (4 mg total) by mouth every 6 (six) hours as needed for nausea.     tetrahydrozoline-zinc 0.05-0.25 % ophthalmic solution  Commonly known as:  VISINE-AC  Place 2 drops into both eyes 3 (three) times daily as needed (eye irritation).        Hospital Procedures: Dg Cholangiogram Operative  06/21/2014   CLINICAL DATA:  Gallstone.  EXAM: INTRAOPERATIVE CHOLANGIOGRAM  TECHNIQUE: Cholangiographic images from the C-arm fluoroscopic device were submitted for interpretation post-operatively. Please see the procedural report for  the amount of contrast and the fluoroscopy time utilized.  COMPARISON:  CT 06/18/2014  FINDINGS: Opacification of the extrahepatic biliary system. The biliary system is not dilated. No evidence for an obstructing stone or lesion. Contrast drains into the duodenum.  IMPRESSION: Normal intraoperative cholangiogram.   Electronically Signed   By: Richarda Overlie M.D.   On: 06/21/2014 15:19   Ct Abdomen Pelvis W Contrast  06/18/2014   CLINICAL DATA:  Initial encounter for upper abdominal pain. Nausea and vomiting. Elevated liver function tests.  EXAM: CT ABDOMEN AND PELVIS WITH CONTRAST  TECHNIQUE: Multidetector CT imaging of the abdomen and pelvis was performed using the standard protocol following bolus administration of intravenous contrast.  CONTRAST:  50mL OMNIPAQUE IOHEXOL 300 MG/ML  SOLN  COMPARISON:  None.  FINDINGS: Lower chest: Scarring at the left lung base. Normal heart size without pericardial or pleural effusion. Probable left coronary artery atherosclerosis. Example image 1.  Hepatobiliary: Mild hepatic steatosis, without focal liver lesion. Gallbladder distention, 12.4 cm on sagittal image 38. Possible small gallstones or sludge dependently. No pericholecystic inflammation identified. No biliary ductal dilatation.  Pancreas: No focal pancreatic abnormality or ductal dilatation.  Spleen: Normal  Adrenals/Urinary Tract: Normal adrenal glands. Normal kidneys, without hydronephrosis. Normal urinary bladder.  Stomach/Bowel: Normal stomach, without wall thickening. Normal colon, appendix, and terminal ileum. Normal small bowel.  Vascular/Lymphatic: Normal  caliber of the aorta and branch vessels. Advanced for age common iliac artery atherosclerosis bilaterally. No abdominopelvic adenopathy.  Reproductive: Normal prostate.  Other: Edema is identified adjacent to the descending duodenum, including on image 44 of series 2. Interstitial thickening extends throughout the right anterior pararenal space, mild. No ascites.  No significant free fluid.  Musculoskeletal: No acute osseous abnormality. Disc bulges at L3-4, L4-5, and L5-S1  IMPRESSION: 1. Gallbladder distention with possible dependent sludge or stones. No specific evidence of acute cholecystitis. 2. Edema within the right anterior pararenal space, adjacent to the descending duodenum. Considerations include focal pancreatitis involving the head/uncinate process, otherwise occult early acute cholecystitis, or duodenitis. Consider correlation with pancreatic enzyme levels and possibly right upper quadrant ultrasound. 3. Age advanced atherosclerosis. Suspect age advanced coronary artery disease. Correlate with risk factors and consider medical therapy.   Electronically Signed   By: Jeronimo Greaves M.D.   On: 06/18/2014 20:14   Dg Chest Port 1 View  06/19/2014   CLINICAL DATA:  Gallstone pancreatitis.  EXAM: PORTABLE CHEST - 1 VIEW  COMPARISON:  None.  FINDINGS: There is elevation of right hemidiaphragm. Low lung volumes. The cardiomediastinal contours are normal. The lungs are clear. Pulmonary vasculature is normal. No consolidation, pleural effusion, or pneumothorax. No acute osseous abnormalities are seen.  IMPRESSION: No acute pulmonary process.  Elevation of right hemidiaphragm.   Electronically Signed   By: Rubye Oaks M.D.   On: 06/19/2014 01:59    History of Present Illness: Admitted w/ gallstone pancreatitis    Hospital Course: Gallstone pancreatitis - underwent noncomplicated lap choley by Dr Carolynne Edouard on 2/8. He is tolerating PO diet of chef salad and soup for lunch. Walking in the halls. Passing gas and had a BM today. Pain is controlled. He is medically ready for discharge. He has rx for norco on chart and zofran is already at pharmacy to treat any potential symptoms . We will follow up in 1 week w/ patient   Coronary atherosclerotic disease - recently had stress test w/ Dr Jacinto Halim that showed inferior wall motion abnormality that did not change w/ stress and  was felt to be 2/2 scaring. On CT of A/P in w/u in hospital, noted he had plaque build up. He is on ASA, statin, ACE which will all be continued at discharge. WIll contine to f/u as outpt   DM - resume home meds, except his metformin needs to be held 48 hours post op. His med list reflects this change and patient was updated as well. Fu as outpt  HLD - on statin   HTN - resume his ACE . Was slightly uncontrolled in hospital , but this was w/o his ACE . F/u as outpt   Day of Discharge Exam BP 162/88 mmHg  Pulse 108  Temp(Src) 98 F (36.7 C) (Oral)  Resp 18  Ht  (1.702 m)  Wt 216 lb 14.4 oz (98.385 kg)  BMI 33.96 kg/m2  SpO2 98%  Physical Exam: General appearance: AAM in NAD  Eyes: no scleral icterus Throat: oropharynx moist without erythema Resp: CTAB, no wr  Cardio: sinus tachycardia , no mrg  GI: soft, TTP at incision sights only Ext: no clubbing, cyanosis or edema  Discharge Labs:  Recent Labs  06/21/14 0520 06/22/14 0455  NA 138 137  K 3.5 4.4  CL 104 101  CO2 29 26  GLUCOSE 112* 301*  BUN <5* 9  CREATININE 0.91 1.24  CALCIUM 7.8* 8.5    Recent Labs  06/21/14 0520 06/22/14 0455  AST 22 75*  ALT 64* 96*  ALKPHOS 204* 225*  BILITOT 0.7 0.6  PROT 5.8* 6.9  ALBUMIN 3.1* 3.3*    Recent Labs  06/21/14 0520 06/22/14 0455  WBC 5.3 11.5*  HGB 13.0 15.3  HCT 39.0 44.9  MCV 84.1 84.2  PLT 166 164   Lab Results  Component Value Date   INR 1.05 06/19/2014   No results for input(s): CKTOTAL, CKMB, CKMBINDEX, TROPONINI in the last 72 hours. No results for input(s): TSH, T4TOTAL, T3FREE, THYROIDAB in the last 72 hours.  Invalid input(s): FREET3 No results for input(s): VITAMINB12, FOLATE, FERRITIN, TIBC, IRON, RETICCTPCT in the last 72 hours.  Discharge instructions: Discharge Instructions    Call MD for:  difficulty breathing, headache or visual disturbances    Complete by:  As directed      Call MD for:  extreme fatigue    Complete by:  As  directed      Call MD for:  hives    Complete by:  As directed      Call MD for:  persistant dizziness or light-headedness    Complete by:  As directed      Call MD for:  persistant nausea and vomiting    Complete by:  As directed      Call MD for:  persistant nausea and vomiting    Complete by:  As directed      Call MD for:  redness, tenderness, or signs of infection (pain, swelling, redness, odor or green/yellow discharge around incision site)    Complete by:  As directed      Call MD for:  severe uncontrolled pain    Complete by:  As directed      Call MD for:  severe uncontrolled pain    Complete by:  As directed      Call MD for:  temperature >100.4    Complete by:  As directed      Diet - low sodium heart healthy    Complete by:  As directed      Diet - low sodium heart healthy    Complete by:  As directed      Discharge instructions    Complete by:  As directed   May shower. Low fat diet. No heavy lifting     Increase activity slowly    Complete by:  As directed      Increase activity slowly    Complete by:  As directed      No wound care    Complete by:  As directed           Final discharge disposition not confirmed Follow-up Information    Follow up with [facility] [place] On 07/06/2014.   Why:  Your appointment is at 2 PM, you need to be there at least 30 minutes before your appointment for check in.   Contact information:   [address] [place] [phone]      Disposition: home   Follow-up Appts: [facility]. We will Call for appointment.  Condition on Discharge: stable   Tests Needing Follow-up:none   Time spent in discharge (includes decision making & examination of pt): 45 minutes    Signed: Haunani Dickard 06/22/2014, 12:49 PM

## 2014-06-22 NOTE — Progress Notes (Signed)
1 Day Post-Op  Subjective: He feels good and is ready to go home  Objective: Vital signs in last 24 hours: Temp:  [97.5 F (36.4 C)-98.2 F (36.8 C)] 98.2 F (36.8 C) (02/09 0406) Pulse Rate:  [82-107] 107 (02/09 0406) Resp:  [12-18] 18 (02/09 0406) BP: (155-197)/(80-102) 155/80 mmHg (02/09 0500) SpO2:  [95 %-100 %] 96 % (02/09 0406) Last BM Date: 06/20/14  Intake/Output from previous day: 02/08 0701 - 02/09 0700 In: 3895.3 [P.O.:360; I.V.:3535.3] Out: 805 [Urine:780; Blood:25] Intake/Output this shift:    Resp: clear to auscultation bilaterally Cardio: regular rate and rhythm GI: soft, minimal tenderness. incisions look good  Lab Results:   Recent Labs  06/21/14 0520 06/22/14 0455  WBC 5.3 11.5*  HGB 13.0 15.3  HCT 39.0 44.9  PLT 166 164   BMET  Recent Labs  06/21/14 0520 06/22/14 0455  NA 138 137  K 3.5 4.4  CL 104 101  CO2 29 26  GLUCOSE 112* 301*  BUN <5* 9  CREATININE 0.91 1.24  CALCIUM 7.8* 8.5   PT/INR No results for input(s): LABPROT, INR in the last 72 hours. ABG No results for input(s): PHART, HCO3 in the last 72 hours.  Invalid input(s): PCO2, PO2  Studies/Results: Dg Cholangiogram Operative  06/21/2014   CLINICAL DATA:  Gallstone.  EXAM: INTRAOPERATIVE CHOLANGIOGRAM  TECHNIQUE: Cholangiographic images from the C-arm fluoroscopic device were submitted for interpretation post-operatively. Please see the procedural report for the amount of contrast and the fluoroscopy time utilized.  COMPARISON:  CT 06/18/2014  FINDINGS: Opacification of the extrahepatic biliary system. The biliary system is not dilated. No evidence for an obstructing stone or lesion. Contrast drains into the duodenum.  IMPRESSION: Normal intraoperative cholangiogram.   Electronically Signed   By: Richarda OverlieAdam  Henn M.D.   On: 06/21/2014 15:19    Anti-infectives: Anti-infectives    Start     Dose/Rate Route Frequency Ordered Stop   06/21/14 1730  ciprofloxacin (CIPRO) IVPB 400 mg      400 mg200 mL/hr over 60 Minutes Intravenous  Once 06/21/14 1636 06/21/14 1807      Assessment/Plan: s/p Procedure(s): LAPAROSCOPIC CHOLECYSTECTOMY WITH INTRAOPERATIVE CHOLANGIOGRAM (N/A) Discharge  LOS: 4 days    TOTH III,PAUL S 06/22/2014

## 2014-06-22 NOTE — Progress Notes (Signed)
1 Day Post-Op  Subjective: He looks fine, and doing well with his full liquids.  He is ready to go home.  Objective: Vital signs in last 24 hours: Temp:  [97.5 F (36.4 C)-98.2 F (36.8 C)] 98.2 F (36.8 C) (02/09 0406) Pulse Rate:  [82-107] 107 (02/09 0406) Resp:  [12-18] 18 (02/09 0406) BP: (155-197)/(80-102) 155/80 mmHg (02/09 0500) SpO2:  [95 %-100 %] 96 % (02/09 0406) Last BM Date: 06/20/14 Full liquid diet Afebrile, Tachycardic last VS 3AM, repeat this AM still 108.   Glucose is up WBC is up some Intake/Output from previous day: 02/08 0701 - 02/09 0700 In: 3895.3 [P.O.:360; I.V.:3535.3] Out: 805 [Urine:780; Blood:25] Intake/Output this shift:    General appearance: alert, cooperative and no distress GI: soft, non-tender; bowel sounds normal; no masses,  no organomegaly  Lab Results:   Recent Labs  06/21/14 0520 06/22/14 0455  WBC 5.3 11.5*  HGB 13.0 15.3  HCT 39.0 44.9  PLT 166 164    BMET  Recent Labs  06/21/14 0520 06/22/14 0455  NA 138 137  K 3.5 4.4  CL 104 101  CO2 29 26  GLUCOSE 112* 301*  BUN <5* 9  CREATININE 0.91 1.24  CALCIUM 7.8* 8.5   PT/INR No results for input(s): LABPROT, INR in the last 72 hours.   Recent Labs Lab 06/19/14 0605 06/20/14 0547 06/21/14 0520 06/22/14 0455  AST 67* 26 22 75*  ALT 98* 60* 64* 96*  ALKPHOS 329* 248* 204* 225*  BILITOT 0.8 0.6 0.7 0.6  PROT 6.4 5.9* 5.8* 6.9  ALBUMIN 3.2* 3.1* 3.1* 3.3*     Lipase     Component Value Date/Time   LIPASE 67* 06/20/2014 0547     Studies/Results: Dg Cholangiogram Operative  06/21/2014   CLINICAL DATA:  Gallstone.  EXAM: INTRAOPERATIVE CHOLANGIOGRAM  TECHNIQUE: Cholangiographic images from the C-arm fluoroscopic device were submitted for interpretation post-operatively. Please see the procedural report for the amount of contrast and the fluoroscopy time utilized.  COMPARISON:  CT 06/18/2014  FINDINGS: Opacification of the extrahepatic biliary system. The  biliary system is not dilated. No evidence for an obstructing stone or lesion. Contrast drains into the duodenum.  IMPRESSION: Normal intraoperative cholangiogram.   Electronically Signed   By: Richarda OverlieAdam  Henn M.D.   On: 06/21/2014 15:19    Medications: . aspirin EC  81 mg Oral Daily  . atorvastatin  40 mg Oral QHS  . heparin  5,000 Units Subcutaneous 3 times per day  . insulin aspart  0-15 Units Subcutaneous TID WC  . insulin aspart  0-5 Units Subcutaneous QHS  . insulin glargine  15 Units Subcutaneous QHS  . lisinopril  20 mg Oral Daily  . sodium chloride  3 mL Intravenous Q12H    Assessment/Plan Gallstone Pancreatitis S/p laparoscopic cholecystectomy, IOC, 06/21/14, Dr. Carolynne Edouardoth AODM Hypertension Tobacco use CAD  Dyslipidemia   Plan:  From a surgical standpoint he is doing well.  I will advance him to carb modified diet, and fill in AVS with discharge information.  He wants to go back to work on 2/16 which is fine with our service.  I recommended just tylenol or Norco for pain at home. He is still tachycardic and I will defer to Medicine on this.    LOS: 4 days    Yeva Bissette 06/22/2014

## 2014-06-22 NOTE — Progress Notes (Signed)
Pt is POD 1 from lap choley , noncomplicated. He was walking halls last night. Tolerating liquid diet. No flatus at this point but belching. Pain is controlled. Educated if is able to advance his diet today w/o emesis or nausea , he will be ready for discharge home this evening. Will follow w/ full note later today   Adam Baker, Adam Rape, MD 06/22/2014 7:45 AM

## 2014-06-23 LAB — GLUCOSE, CAPILLARY: Glucose-Capillary: 86 mg/dL (ref 70–99)

## 2015-04-24 ENCOUNTER — Encounter (HOSPITAL_COMMUNITY): Payer: Self-pay

## 2015-04-24 ENCOUNTER — Emergency Department (HOSPITAL_COMMUNITY)
Admission: EM | Admit: 2015-04-24 | Discharge: 2015-04-25 | Disposition: A | Discharge status: Home / Self Care | Payer: Self-pay | Attending: Emergency Medicine | Admitting: Emergency Medicine

## 2015-04-24 DIAGNOSIS — R945 Abnormal results of liver function studies: Secondary | ICD-10-CM

## 2015-04-24 DIAGNOSIS — F17210 Nicotine dependence, cigarettes, uncomplicated: Secondary | ICD-10-CM | POA: Insufficient documentation

## 2015-04-24 DIAGNOSIS — R51 Headache: Secondary | ICD-10-CM | POA: Insufficient documentation

## 2015-04-24 DIAGNOSIS — E119 Type 2 diabetes mellitus without complications: Secondary | ICD-10-CM | POA: Insufficient documentation

## 2015-04-24 DIAGNOSIS — Z7984 Long term (current) use of oral hypoglycemic drugs: Secondary | ICD-10-CM | POA: Insufficient documentation

## 2015-04-24 DIAGNOSIS — Z7982 Long term (current) use of aspirin: Secondary | ICD-10-CM | POA: Insufficient documentation

## 2015-04-24 DIAGNOSIS — R7989 Other specified abnormal findings of blood chemistry: Secondary | ICD-10-CM | POA: Insufficient documentation

## 2015-04-24 DIAGNOSIS — I10 Essential (primary) hypertension: Secondary | ICD-10-CM | POA: Insufficient documentation

## 2015-04-24 DIAGNOSIS — Z794 Long term (current) use of insulin: Secondary | ICD-10-CM | POA: Insufficient documentation

## 2015-04-24 DIAGNOSIS — Z79899 Other long term (current) drug therapy: Secondary | ICD-10-CM | POA: Insufficient documentation

## 2015-04-24 DIAGNOSIS — R110 Nausea: Secondary | ICD-10-CM | POA: Insufficient documentation

## 2015-04-24 DIAGNOSIS — N289 Disorder of kidney and ureter, unspecified: Secondary | ICD-10-CM | POA: Insufficient documentation

## 2015-04-24 DIAGNOSIS — H538 Other visual disturbances: Secondary | ICD-10-CM | POA: Insufficient documentation

## 2015-04-24 DIAGNOSIS — R200 Anesthesia of skin: Secondary | ICD-10-CM | POA: Insufficient documentation

## 2015-04-24 DIAGNOSIS — Z880 Allergy status to penicillin: Secondary | ICD-10-CM | POA: Insufficient documentation

## 2015-04-24 DIAGNOSIS — R519 Headache, unspecified: Secondary | ICD-10-CM

## 2015-04-24 MED ORDER — SODIUM CHLORIDE 0.9 % IV SOLN
1000.0000 mL | Freq: Once | INTRAVENOUS | Status: AC
  Administered 2015-04-24: 1000 mL via INTRAVENOUS

## 2015-04-24 MED ORDER — METOCLOPRAMIDE HCL 5 MG/ML IJ SOLN
10.0000 mg | Freq: Once | INTRAMUSCULAR | Status: AC
  Administered 2015-04-24: 10 mg via INTRAVENOUS
  Filled 2015-04-24: qty 2

## 2015-04-24 MED ORDER — SODIUM CHLORIDE 0.9 % IV SOLN
1000.0000 mL | INTRAVENOUS | Status: DC
  Administered 2015-04-24: 1000 mL via INTRAVENOUS

## 2015-04-24 MED ORDER — DIPHENHYDRAMINE HCL 50 MG/ML IJ SOLN
25.0000 mg | Freq: Once | INTRAMUSCULAR | Status: AC
  Administered 2015-04-24: 25 mg via INTRAVENOUS
  Filled 2015-04-24: qty 1

## 2015-04-24 NOTE — ED Notes (Signed)
Pt states he has been having headaches off and on lately but it got worse tonight. A little nausea but no vomiting. BP is high in triage also. Taken off lisinopril by MD for the past month because he states the MD wants to check his liver.

## 2015-04-24 NOTE — ED Provider Notes (Signed)
CSN: [id number]     Arrival date & time 04/24/15  2225 History   By signing my name below, I, Adam Baker, attest that this documentation has been prepared under the direction and in the presence of Adam Boozeavid Eudell Mcphee, MD.  Electronically Signed: Arlan OrganAshley Baker, ED Scribe. 04/24/2015. 11:32 PM.    Chief Complaint  Patient presents with  . Headache  . Blurred Vision   The history is provided by the patient. No language interpreter was used.    HPI Comments: Adam Baker is a 49 y.o. male with a PMHx of DM who presents to the Emergency Department complaining of an intermittent, ongoing L parietal HA x 2 weeks; worsened and constant this evening. Pain is described as throbbing and currently rated 9.5/10. Most recent episode started at approximately 9:30 PM. Pt states HA is made worse when coughing. No alleviating factors at this time. Mr. Adam Baker also reports nausea, mild blurred vision, and numbness to fingertips. OTC ASA attempted prior to arrival with mild temporary improvement. No recent fever, chills, vomiting, chest pain, or shortness of breath. Pt was taken off of his Lisinopril medication and has been without for 1 month now. He is still taking his other medications at prescribed but did not take his evening medications prior to tonight's visit.   PCP: Alysia PennaHOLWERDA, SCOTT, MD    Past Medical History  Diagnosis Date  . Diabetes mellitus without complication [facility](HCC)    Past Surgical History  Procedure Laterality Date  . Colonoscopy    . Cholecystectomy N/A 06/21/2014    Procedure: LAPAROSCOPIC CHOLECYSTECTOMY WITH INTRAOPERATIVE CHOLANGIOGRAM;  Surgeon: Chevis PrettyPaul Toth III, MD;  Location: [facility]S;  Service: General;  Laterality: N/A;   Family History  Problem Relation Age of Onset  . Diabetes Mother   . Diabetes Sister   . Diabetes Brother    Social History  Substance Use Topics  . Smoking status: Light Tobacco Smoker    Types: Cigarettes  . Smokeless tobacco: Never Used  . Alcohol Use: Yes      Comment: 4-5 beers a week    Review of Systems  Eyes: Positive for visual disturbance.  Respiratory: Negative for shortness of breath.   Cardiovascular: Negative for chest pain.  Gastrointestinal: Positive for nausea. Negative for vomiting, abdominal pain and diarrhea.  Musculoskeletal: Negative for back pain.  Neurological: Positive for numbness and headaches.  Psychiatric/Behavioral: Negative for confusion.  All other systems reviewed and are negative.     Allergies  Penicillins and Shrimp  Home Medications   Prior to Admission medications   Medication Sig Start Date End Date Taking? Authorizing Provider  aspirin EC 81 MG tablet Take 81 mg by mouth daily.    Historical Provider, MD  atorvastatin (LIPITOR) 40 MG tablet Take 40 mg by mouth at bedtime.    Historical Provider, MD  HYDROcodone-acetaminophen (NORCO) 5-325 MG per tablet Take 1 tablet by mouth every 6 (six) hours as needed for moderate pain. 06/22/14   Alysia PennaScott Holwerda, MD  insulin NPH Human (NOVOLIN N) 100 UNIT/ML injection Inject 18U in qAM and 20U qPM 06/22/14   Alysia PennaScott Holwerda, MD  insulin regular (NOVOLIN R) 100 units/mL injection Inject 7U before lunch 06/22/14   Alysia PennaScott Holwerda, MD  lisinopril (PRINIVIL,ZESTRIL) 20 MG tablet Take 1 tablet (20 mg total) by mouth daily. 06/22/14   Alysia PennaScott Holwerda, MD  metFORMIN (GLUCOPHAGE) 1000 MG tablet Take 1 tab bid starting with first dosage on evening of 2/10 06/22/14   Alysia PennaScott Holwerda, MD  ondansetron [facility](ZOFRAN) 4  MG tablet Take 1 tablet (4 mg total) by mouth every 6 (six) hours as needed for nausea. 06/22/14   [name], MD  tetrahydrozoline-zinc (VISINE-AC) 0.05-0.25 % ophthalmic solution Place 2 drops into both eyes 3 (three) times daily as needed (eye irritation).    Historical Provider, MD   Triage Vitals: BP 183/108 mmHg  Pulse 84  Temp(Src) 98.5 F (36.9 C) (Oral)  Resp 18  Ht  (1.702 m)  Wt 208 lb (94.348 kg)  BMI 32.57 kg/m2  SpO2 100%   Physical Exam  Constitutional:  He is oriented to person, place, and time. He appears well-developed and well-nourished.  HENT:  Head: Normocephalic and atraumatic.  No tenderness over temporalis muscles or paracervical muscles   Eyes: EOM are normal. Pupils are equal, round, and reactive to light.  Fundi are normal  Neck: Normal range of motion. Neck supple. No JVD present.  Cardiovascular: Normal rate, regular rhythm and intact distal pulses.   No murmur heard. Pulmonary/Chest: Effort normal and breath sounds normal. He has no wheezes. He has no rales. He exhibits no tenderness.  Abdominal: Soft. Bowel sounds are normal. He exhibits no distension and no mass. There is no tenderness.  Musculoskeletal: Normal range of motion. He exhibits no edema.  Lymphadenopathy:    He has no cervical adenopathy.  Neurological: He is alert and oriented to person, place, and time. No cranial nerve deficit. He exhibits normal muscle tone. Coordination normal.  Skin: Skin is warm and dry. No rash noted.  Psychiatric: He has a normal mood and affect. His behavior is normal. Judgment and thought content normal.  Nursing note and vitals reviewed.   ED Course  Procedures (including critical care time)  DIAGNOSTIC STUDIES: Oxygen Saturation is 100% on RA, Normal by my interpretation.    COORDINATION OF CARE: 11:23 PM- Will give fluids, Reglan, and Benadryl. Will order CBC and CMP. Discussed treatment plan with pt at bedside and pt agreed to plan.     Labs Review Results for orders placed or performed during the hospital encounter of 04/24/15  Comprehensive metabolic panel  Result Value Ref Range   Sodium 135 135 - 145 mmol/L   Potassium 3.5 3.5 - 5.1 mmol/L   Chloride 97 (L) 101 - 111 mmol/L   CO2 28 22 - 32 mmol/L   Glucose, Bld 268 (H) 65 - 99 mg/dL   BUN 20 6 - 20 mg/dL   Creatinine, Ser 4.69 (H) 0.61 - 1.24 mg/dL   Calcium 8.6 (L) 8.9 - 10.3 mg/dL   Total Protein 6.8 6.5 - 8.1 g/dL   Albumin 3.0 (L) 3.5 - 5.0 g/dL   AST 82  (H) 15 - 41 U/L   ALT 89 (H) 17 - 63 U/L   Alkaline Phosphatase 549 (H) 38 - 126 U/L   Total Bilirubin 1.7 (H) 0.3 - 1.2 mg/dL   GFR calc non Af Amer 57 (L) >60 mL/min   GFR calc Af Amer >60 >60 mL/min   Anion gap 10 5 - 15  CBC with Differential  Result Value Ref Range   WBC 9.8 4.0 - 10.5 K/uL   RBC 4.43 4.22 - 5.81 MIL/uL   Hemoglobin 12.7 (L) 13.0 - 17.0 g/dL   HCT 62.9 (L) 52.8 - 41.3 %   MCV 83.5 78.0 - 100.0 fL   MCH 28.7 26.0 - 34.0 pg   MCHC 34.3 30.0 - 36.0 g/dL   RDW 24.4 01.0 - 27.2 %   Platelets 190 150 -  400 K/uL   Neutrophils Relative % 71 %   Neutro Abs 6.9 1.7 - 7.7 K/uL   Lymphocytes Relative 16 %   Lymphs Abs 1.6 0.7 - 4.0 K/uL   Monocytes Relative 10 %   Monocytes Absolute 0.9 0.1 - 1.0 K/uL   Eosinophils Relative 3 %   Eosinophils Absolute 0.3 0.0 - 0.7 K/uL   Basophils Relative 0 %   Basophils Absolute 0.0 0.0 - 0.1 K/uL                                                                             Imaging Review Ct Head Wo Contrast  04/25/2015  CLINICAL DATA:  Intermittent ongoing left parietal headache for 2 weeks, worsening this evening. Nausea, mild blurred vision, and numbness in the fingertips. EXAM: CT HEAD WITHOUT CONTRAST TECHNIQUE: Contiguous axial images were obtained from the base of the skull through the vertex without intravenous contrast. COMPARISON:  None. FINDINGS: Ventricles and sulci appear symmetrical. No ventricular dilatation. No mass effect or midline shift. No abnormal extra-axial fluid collections. Gray-white matter junctions are distinct. Basal cisterns are not effaced. No evidence of acute intracranial hemorrhage. No depressed skull fractures. Mild mucosal thickening in the paranasal sinuses. No acute air-fluid levels. Mastoid air cells are not opacified. Vascular calcifications. IMPRESSION: No acute intracranial abnormalities. Electronically Signed   By: Burman Nieves M.D.   On: 04/25/2015 02:16   I have personally  reviewed and evaluated these images and lab results as part of my medical decision-making.   MDM   Final diagnoses:  Headache, unspecified headache type  Essential hypertension  Elevated liver function tests  Renal insufficiency    Headache of uncertain cause. Some aspects are somewhat suggestive of migraine. Hypertension. He is observed in the ED and hypertension has been somewhat labile. He he states that he had been taken off lisinopril because of concern of liver functions. Metabolic panel is checked and he continues to have modest elevation in transaminases but now has significant elevation of alkaline phosphatase and mild elevation of bilirubin. This will need outpatient evaluation. He denies any problems with abdominal pain or appetite. He was initially treated with IV fluids, metoclopramide, diphenhydramine with moderate relief of pain. He was given a dose of ketorolac with complete relief of pain. He was sent for CT of the head which showed no acute process. After his ketorolac dose, it was noted that he did have an elevation of his creatinine over baseline. I have explained the importance of this to him that in light of his history of diabetes and hypertension. He is referred back to his PCP for further outpatient workup. He is discharged with prescription for tramadol to use as needed for pain.  I personally performed the services described in this documentation, which was scribed in my presence. The recorded information has been reviewed and is accurate.     Adam Booze, MD 04/25/15 [phone]

## 2015-04-25 ENCOUNTER — Emergency Department (HOSPITAL_COMMUNITY): Payer: 59

## 2015-04-25 LAB — CBC WITH DIFFERENTIAL/PLATELET
Basophils Absolute: 0 10*3/uL (ref 0.0–0.1)
Basophils Relative: 0 %
EOS ABS: 0.3 10*3/uL (ref 0.0–0.7)
Eosinophils Relative: 3 %
HCT: 37 % — ABNORMAL LOW (ref 39.0–52.0)
HEMOGLOBIN: 12.7 g/dL — AB (ref 13.0–17.0)
Lymphocytes Relative: 16 %
Lymphs Abs: 1.6 10*3/uL (ref 0.7–4.0)
MCH: 28.7 pg (ref 26.0–34.0)
MCHC: 34.3 g/dL (ref 30.0–36.0)
MCV: 83.5 fL (ref 78.0–100.0)
MONOS PCT: 10 %
Monocytes Absolute: 0.9 10*3/uL (ref 0.1–1.0)
NEUTROS PCT: 71 %
Neutro Abs: 6.9 10*3/uL (ref 1.7–7.7)
Platelets: 190 10*3/uL (ref 150–400)
RBC: 4.43 MIL/uL (ref 4.22–5.81)
RDW: 13.4 % (ref 11.5–15.5)
WBC: 9.8 10*3/uL (ref 4.0–10.5)

## 2015-04-25 LAB — COMPREHENSIVE METABOLIC PANEL
ALK PHOS: 549 U/L — AB (ref 38–126)
ALT: 89 U/L — AB (ref 17–63)
AST: 82 U/L — ABNORMAL HIGH (ref 15–41)
Albumin: 3 g/dL — ABNORMAL LOW (ref 3.5–5.0)
Anion gap: 10 (ref 5–15)
BUN: 20 mg/dL (ref 6–20)
CO2: 28 mmol/L (ref 22–32)
CREATININE: 1.43 mg/dL — AB (ref 0.61–1.24)
Calcium: 8.6 mg/dL — ABNORMAL LOW (ref 8.9–10.3)
Chloride: 97 mmol/L — ABNORMAL LOW (ref 101–111)
GFR, EST NON AFRICAN AMERICAN: 57 mL/min — AB (ref >60)
Glucose, Bld: 268 mg/dL — ABNORMAL HIGH (ref 65–99)
Potassium: 3.5 mmol/L (ref 3.5–5.1)
Sodium: 135 mmol/L (ref 135–145)
Total Bilirubin: 1.7 mg/dL — ABNORMAL HIGH (ref 0.3–1.2)
Total Protein: 6.8 g/dL (ref 6.5–8.1)

## 2015-04-25 MED ORDER — NAPROXEN 500 MG PO TABS: 500.0000 mg | ORAL_TABLET | Freq: Two times a day (BID) | ORAL | Status: DC

## 2015-04-25 MED ORDER — TRAMADOL HCL 50 MG PO TABS: 50.0000 mg | ORAL_TABLET | Freq: Four times a day (QID) | ORAL | Status: DC | PRN

## 2015-04-25 MED ORDER — KETOROLAC TROMETHAMINE 30 MG/ML IJ SOLN
30.0000 mg | Freq: Once | INTRAMUSCULAR | Status: AC
  Administered 2015-04-25: 30 mg via INTRAVENOUS
  Filled 2015-04-25: qty 1

## 2015-04-25 NOTE — ED Notes (Signed)
Pt verbalized understanding of d/c instructions, prescriptions, and follow-up care. No further questions/concerns, VSS, ambulatory w/ steady gait (refused wheelchair) 

## 2015-04-25 NOTE — Discharge Instructions (Signed)
Please check your blood pressure at least once a day for the next week. Keep a record of it and take that record with you when you see your primary care provider.   Some of your liver tests have not changed, but some are worse. Your doctor will need to run some tests about her liver as an outpatient.  Your creatinine, blood tests of your kidneys, has been increasing. Avoid taking anti-inflammatory medications like ibuprofen and naproxen because they can harm your kidney.  General Headache Without Cause A headache is pain or discomfort felt around the head or neck area. The specific cause of a headache may not be found. There are many causes and types of headaches. A few common ones are:  Tension headaches.  Migraine headaches.  Cluster headaches.  Chronic daily headaches. HOME CARE INSTRUCTIONS  Watch your condition for any changes. Take these steps to help with your condition: Managing Pain  Take over-the-counter and prescription medicines only as told by your health care provider.  Lie down in a dark, quiet room when you have a headache.  If directed, apply ice to the head and neck area:  Put ice in a plastic bag.  Place a towel between your skin and the bag.  Leave the ice on for 20 minutes, 2-3 times per day.  Use a heating pad or hot shower to apply heat to the head and neck area as told by your health care provider.  Keep lights dim if bright lights bother you or make your headaches worse. Eating and Drinking  Eat meals on a regular schedule.  Limit alcohol use.  Decrease the amount of caffeine you drink, or stop drinking caffeine. General Instructions  Keep all follow-up visits as told by your health care provider. This is important.  Keep a headache journal to help find out what may trigger your headaches. For example, write down:  What you eat and drink.  How much sleep you get.  Any change to your diet or medicines.  Try massage or other relaxation  techniques.  Limit stress.  Sit up straight, and do not tense your muscles.  Do not use tobacco products, including cigarettes, chewing tobacco, or e-cigarettes. If you need help quitting, ask your health care provider.  Exercise regularly as told by your health care provider.  Sleep on a regular schedule. Get 7-9 hours of sleep, or the amount recommended by your health care provider. SEEK MEDICAL CARE IF:   Your symptoms are not helped by medicine.  You have a headache that is different from the usual headache.  You have nausea or you vomit.  You have a fever. SEEK IMMEDIATE MEDICAL CARE IF:   Your headache becomes severe.  You have repeated vomiting.  You have a stiff neck.  You have a loss of vision.  You have problems with speech.  You have pain in the eye or ear.  You have muscular weakness or loss of muscle control.  You lose your balance or have trouble walking.  You feel faint or pass out.  You have confusion.   This information is not intended to replace advice given to you by your health care provider. Make sure you discuss any questions you have with your health care provider.   Document Released: 04/30/2005 Document Revised: 01/19/2015 Document Reviewed: 08/23/2014 Elsevier Interactive Patient Education 2016 [employer].  Hypertension Hypertension, commonly called high blood pressure, is when the force of blood pumping through your arteries is too strong. Your  arteries are the blood vessels that carry blood from your heart throughout your body. A blood pressure reading consists of a higher number over a lower number, such as 110/72. The higher number (systolic) is the pressure inside your arteries when your heart pumps. The lower number (diastolic) is the pressure inside your arteries when your heart relaxes. Ideally you want your blood pressure below 120/80. Hypertension forces your heart to work harder to pump blood. Your arteries may become narrow or  stiff. Having untreated or uncontrolled hypertension can cause heart attack, stroke, kidney disease, and other problems. RISK FACTORS Some risk factors for high blood pressure are controllable. Others are not.  Risk factors you cannot control include:   Race. You may be at higher risk if you are African American.  Age. Risk increases with age.  Gender. Men are at higher risk than women before age 69 years. After age 5, women are at higher risk than men. Risk factors you can control include:  Not getting enough exercise or physical activity.  Being overweight.  Getting too much fat, sugar, calories, or salt in your diet.  Drinking too much alcohol. SIGNS AND SYMPTOMS Hypertension does not usually cause signs or symptoms. Extremely high blood pressure (hypertensive crisis) may cause headache, anxiety, shortness of breath, and nosebleed. DIAGNOSIS To check if you have hypertension, your health care provider will measure your blood pressure while you are seated, with your arm held at the level of your heart. It should be measured at least twice using the same arm. Certain conditions can cause a difference in blood pressure between your right and left arms. A blood pressure reading that is higher than normal on one occasion does not mean that you need treatment. If it is not clear whether you have high blood pressure, you may be asked to return on a different day to have your blood pressure checked again. Or, you may be asked to monitor your blood pressure at home for 1 or more weeks. TREATMENT Treating high blood pressure includes making lifestyle changes and possibly taking medicine. Living a healthy lifestyle can help lower high blood pressure. You may need to change some of your habits. Lifestyle changes may include:  Following the DASH diet. This diet is high in fruits, vegetables, and whole grains. It is low in salt, red meat, and added sugars.  Keep your sodium intake below 2,300 mg  per day.  Getting at least 30-45 minutes of aerobic exercise at least 4 times per week.  Losing weight if necessary.  Not smoking.  Limiting alcoholic beverages.  Learning ways to reduce stress. Your health care provider may prescribe medicine if lifestyle changes are not enough to get your blood pressure under control, and if one of the following is true:  You are 58-63 years of age and your systolic blood pressure is above 140.  You are 42 years of age or older, and your systolic blood pressure is above 150.  Your diastolic blood pressure is above 90.  You have diabetes, and your systolic blood pressure is over 140 or your diastolic blood pressure is over 90.  You have kidney disease and your blood pressure is above 140/90.  You have heart disease and your blood pressure is above 140/90. Your personal target blood pressure may vary depending on your medical conditions, your age, and other factors. HOME CARE INSTRUCTIONS  Have your blood pressure rechecked as directed by your health care provider.   Take medicines only as directed  by your health care provider. Follow the directions carefully. Blood pressure medicines must be taken as prescribed. The medicine does not work as well when you skip doses. Skipping doses also puts you at risk for problems.  Do not smoke.   Monitor your blood pressure at home as directed by your health care provider. SEEK MEDICAL CARE IF:   You think you are having a reaction to medicines taken.  You have recurrent headaches or feel dizzy.  You have swelling in your ankles.  You have trouble with your vision. SEEK IMMEDIATE MEDICAL CARE IF:  You develop a severe headache or confusion.  You have unusual weakness, numbness, or feel faint.  You have severe chest or abdominal pain.  You vomit repeatedly.  You have trouble breathing. MAKE SURE YOU:   Understand these instructions.  Will watch your condition.  Will get help right  away if you are not doing well or get worse.   This information is not intended to replace advice given to you by your health care provider. Make sure you discuss any questions you have with your health care provider.   Document Released: 04/30/2005 Document Revised: 09/14/2014 Document Reviewed: 02/20/2013 Elsevier Interactive Patient Education 2016 Else[employer] Inc[employer]  Tramadol tablets What is this medicine? TRAMADOL (TRA ma dole) is a pain reliever. It is used to treat moderate to severe pain in adults. This medicine may be used for other purposes; ask your health care provider or pharmacist if you have questions. What should I tell my health care provider before I take this medicine? They need to know if you have any of these conditions: -brain tumor -depression -drug abuse or addiction -head injury -if you frequently drink alcohol containing drinks -kidney disease or trouble passing urine -liver disease -lung disease, asthma, or breathing problems -seizures or epilepsy -suicidal thoughts, plans, or attempt; a previous suicide attempt by you or a family member -an unusual or allergic reaction to tramadol, codeine, other medicines, foods, dyes, or preservatives -pregnant or trying to get pregnant -breast-feeding How should I use this medicine? Take this medicine by mouth with a full glass of water. Follow the directions on the prescription label. If the medicine upsets your stomach, take it with food or milk. Do not take more medicine than you are told to take. Talk to your pediatrician regarding the use of this medicine in children. Special care may be needed. Overdosage: If you think you have taken too much of this medicine contact a poison control center or emergency room at once. NOTE: This medicine is only for you. Do not share this medicine with others. What if I miss a dose? If you miss a dose, take it as soon as you can. If it is almost time for your next dose, take only that  dose. Do not take double or extra doses. What may interact with this medicine? Do not take this medicine with any of the following medications: -MAOIs like Carbex, Eldepryl, Marplan, Nardil, and Parnate This medicine may also interact with the following medications: -alcohol or medicines that contain alcohol -antihistamines -benzodiazepines -bupropion -carbamazepine or oxcarbazepine -clozapine -cyclobenzaprine -digoxin -furazolidone -linezolid -medicines for depression, anxiety, or psychotic disturbances -medicines for migraine headache like almotriptan, eletriptan, frovatriptan, naratriptan, rizatriptan, sumatriptan, zolmitriptan -medicines for pain like pentazocine, buprenorphine, butorphanol, meperidine, nalbuphine, and propoxyphene -medicines for sleep -muscle relaxants -naltrexone -phenobarbital -phenothiazines like perphenazine, thioridazine, chlorpromazine, mesoridazine, fluphenazine, prochlorperazine, promazine, and trifluoperazine -procarbazine -warfarin This list may not describe all possible interactions. Give your health  care provider a list of all the medicines, herbs, non-prescription drugs, or dietary supplements you use. Also tell them if you smoke, drink alcohol, or use illegal drugs. Some items may interact with your medicine. What should I watch for while using this medicine? Tell your doctor or health care professional if your pain does not go away, if it gets worse, or if you have new or a different type of pain. You may develop tolerance to the medicine. Tolerance means that you will need a higher dose of the medicine for pain relief. Tolerance is normal and is expected if you take this medicine for a long time. Do not suddenly stop taking your medicine because you may develop a severe reaction. Your body becomes used to the medicine. This does NOT mean you are addicted. Addiction is a behavior related to getting and using a drug for a non-medical reason. If you have  pain, you have a medical reason to take pain medicine. Your doctor will tell you how much medicine to take. If your doctor wants you to stop the medicine, the dose will be slowly lowered over time to avoid any side effects. You may get drowsy or dizzy. Do not drive, use machinery, or do anything that needs mental alertness until you know how this medicine affects you. Do not stand or sit up quickly, especially if you are an older patient. This reduces the risk of dizzy or fainting spells. Alcohol can increase or decrease the effects of this medicine. Avoid alcoholic drinks. You may have constipation. Try to have a bowel movement at least every 2 to 3 days. If you do not have a bowel movement for 3 days, call your doctor or health care professional. Your mouth may get dry. Chewing sugarless gum or sucking hard candy, and drinking plenty of water may help. Contact your doctor if the problem does not go away or is severe. What side effects may I notice from receiving this medicine? Side effects that you should report to your doctor or health care professional as soon as possible: -allergic reactions like skin rash, itching or hives, swelling of the face, lips, or tongue -breathing difficulties, wheezing -confusion -itching -light headedness or fainting spells -redness, blistering, peeling or loosening of the skin, including inside the mouth -seizures Side effects that usually do not require medical attention (report to your doctor or health care professional if they continue or are bothersome): -constipation -dizziness -drowsiness -headache -nausea, vomiting This list may not describe all possible side effects. Call your doctor for medical advice about side effects. You may report side effects to FDA at 1-800-FDA-1088. Where should I keep my medicine? Keep out of the reach of children. This medicine may cause accidental overdose and death if it taken by other adults, children, or pets. Mix any  unused medicine with a substance like cat litter or coffee grounds. Then throw the medicine away in a sealed container like a sealed bag or a coffee can with a lid. Do not use the medicine after the expiration date. Store at room temperature between 15 and 30 degrees C (59 and 86 degrees F). NOTE: This sheet is a summary. It may not cover all possible information. If you have questions about this medicine, talk to your doctor, pharmacist, or health care provider.    2016, Elsevier/Gold Standard. (2013-06-26 15:42:09)

## 2015-05-26 ENCOUNTER — Other Ambulatory Visit: Payer: Self-pay | Admitting: Internal Medicine

## 2015-05-26 DIAGNOSIS — R748 Abnormal levels of other serum enzymes: Secondary | ICD-10-CM

## 2015-05-30 ENCOUNTER — Ambulatory Visit
Admission: RE | Admit: 2015-05-30 | Discharge: 2015-05-30 | Disposition: A | Discharge status: Home / Self Care | Payer: BLUE CROSS/BLUE SHIELD | Source: Ambulatory Visit | Attending: Internal Medicine | Admitting: Internal Medicine

## 2015-05-30 DIAGNOSIS — R748 Abnormal levels of other serum enzymes: Secondary | ICD-10-CM

## 2015-06-29 ENCOUNTER — Encounter: Payer: Self-pay | Admitting: Internal Medicine

## 2015-11-28 ENCOUNTER — Observation Stay (HOSPITAL_COMMUNITY)
Admission: EM | Admit: 2015-11-28 | Discharge: 2015-12-02 | Disposition: A | Discharge status: Home / Self Care | Payer: BLUE CROSS/BLUE SHIELD | Attending: Family Medicine | Admitting: Family Medicine

## 2015-11-28 ENCOUNTER — Emergency Department (HOSPITAL_COMMUNITY): Payer: BLUE CROSS/BLUE SHIELD

## 2015-11-28 ENCOUNTER — Encounter (HOSPITAL_COMMUNITY): Payer: Self-pay | Admitting: Emergency Medicine

## 2015-11-28 DIAGNOSIS — E785 Hyperlipidemia, unspecified: Secondary | ICD-10-CM | POA: Insufficient documentation

## 2015-11-28 DIAGNOSIS — N179 Acute kidney failure, unspecified: Secondary | ICD-10-CM | POA: Diagnosis not present

## 2015-11-28 DIAGNOSIS — G8929 Other chronic pain: Secondary | ICD-10-CM | POA: Insufficient documentation

## 2015-11-28 DIAGNOSIS — D649 Anemia, unspecified: Secondary | ICD-10-CM | POA: Diagnosis not present

## 2015-11-28 DIAGNOSIS — Z7982 Long term (current) use of aspirin: Secondary | ICD-10-CM | POA: Insufficient documentation

## 2015-11-28 DIAGNOSIS — K7460 Unspecified cirrhosis of liver: Principal | ICD-10-CM | POA: Insufficient documentation

## 2015-11-28 DIAGNOSIS — IMO0001 Reserved for inherently not codable concepts without codable children: Secondary | ICD-10-CM | POA: Insufficient documentation

## 2015-11-28 DIAGNOSIS — I129 Hypertensive chronic kidney disease with stage 1 through stage 4 chronic kidney disease, or unspecified chronic kidney disease: Secondary | ICD-10-CM | POA: Insufficient documentation

## 2015-11-28 DIAGNOSIS — IMO0002 Reserved for concepts with insufficient information to code with codable children: Secondary | ICD-10-CM | POA: Diagnosis present

## 2015-11-28 DIAGNOSIS — I160 Hypertensive urgency: Secondary | ICD-10-CM | POA: Insufficient documentation

## 2015-11-28 DIAGNOSIS — R17 Unspecified jaundice: Secondary | ICD-10-CM

## 2015-11-28 DIAGNOSIS — E119 Type 2 diabetes mellitus without complications: Secondary | ICD-10-CM

## 2015-11-28 DIAGNOSIS — Z794 Long term (current) use of insulin: Secondary | ICD-10-CM | POA: Insufficient documentation

## 2015-11-28 DIAGNOSIS — N183 Chronic kidney disease, stage 3 (moderate): Secondary | ICD-10-CM | POA: Insufficient documentation

## 2015-11-28 DIAGNOSIS — R7989 Other specified abnormal findings of blood chemistry: Secondary | ICD-10-CM

## 2015-11-28 DIAGNOSIS — Z79899 Other long term (current) drug therapy: Secondary | ICD-10-CM | POA: Insufficient documentation

## 2015-11-28 DIAGNOSIS — E876 Hypokalemia: Secondary | ICD-10-CM | POA: Diagnosis not present

## 2015-11-28 DIAGNOSIS — M545 Low back pain: Secondary | ICD-10-CM | POA: Insufficient documentation

## 2015-11-28 DIAGNOSIS — I10 Essential (primary) hypertension: Secondary | ICD-10-CM | POA: Diagnosis present

## 2015-11-28 DIAGNOSIS — Z6830 Body mass index (BMI) 30.0-30.9, adult: Secondary | ICD-10-CM | POA: Insufficient documentation

## 2015-11-28 DIAGNOSIS — E1122 Type 2 diabetes mellitus with diabetic chronic kidney disease: Secondary | ICD-10-CM | POA: Diagnosis not present

## 2015-11-28 DIAGNOSIS — E1165 Type 2 diabetes mellitus with hyperglycemia: Secondary | ICD-10-CM | POA: Diagnosis not present

## 2015-11-28 DIAGNOSIS — E440 Moderate protein-calorie malnutrition: Secondary | ICD-10-CM | POA: Diagnosis not present

## 2015-11-28 DIAGNOSIS — R945 Abnormal results of liver function studies: Secondary | ICD-10-CM

## 2015-11-28 DIAGNOSIS — M549 Dorsalgia, unspecified: Secondary | ICD-10-CM | POA: Diagnosis present

## 2015-11-28 DIAGNOSIS — N289 Disorder of kidney and ureter, unspecified: Secondary | ICD-10-CM

## 2015-11-28 HISTORY — DX: Essential (primary) hypertension: I10

## 2015-11-28 LAB — HEPATIC FUNCTION PANEL
ALBUMIN: 2.1 g/dL — AB (ref 3.5–5.0)
ALT: 105 U/L — ABNORMAL HIGH (ref 17–63)
AST: 135 U/L — ABNORMAL HIGH (ref 15–41)
Alkaline Phosphatase: 1496 U/L — ABNORMAL HIGH (ref 38–126)
Bilirubin, Direct: 7 mg/dL — ABNORMAL HIGH (ref 0.1–0.5)
Indirect Bilirubin: 4.4 mg/dL — ABNORMAL HIGH (ref 0.3–0.9)
TOTAL PROTEIN: 7 g/dL (ref 6.5–8.1)
Total Bilirubin: 11.4 mg/dL — ABNORMAL HIGH (ref 0.3–1.2)

## 2015-11-28 LAB — CBC
HCT: 31.7 % — ABNORMAL LOW (ref 39.0–52.0)
Hemoglobin: 10.9 g/dL — ABNORMAL LOW (ref 13.0–17.0)
MCH: 28.6 pg (ref 26.0–34.0)
MCHC: 34.4 g/dL (ref 30.0–36.0)
MCV: 83.2 fL (ref 78.0–100.0)
PLATELETS: 228 10*3/uL (ref 150–400)
RBC: 3.81 MIL/uL — AB (ref 4.22–5.81)
RDW: 17.4 % — ABNORMAL HIGH (ref 11.5–15.5)
WBC: 10.5 10*3/uL (ref 4.0–10.5)

## 2015-11-28 LAB — URINALYSIS, ROUTINE W REFLEX MICROSCOPIC
GLUCOSE, UA: 100 mg/dL — AB
Ketones, ur: 15 mg/dL — AB
Nitrite: NEGATIVE
PH: 5.5 (ref 5.0–8.0)
SPECIFIC GRAVITY, URINE: 1.02 (ref 1.005–1.030)

## 2015-11-28 LAB — COMPREHENSIVE METABOLIC PANEL
ALBUMIN: 2.1 g/dL — AB (ref 3.5–5.0)
ALT: 104 U/L — ABNORMAL HIGH (ref 17–63)
ANION GAP: 5 (ref 5–15)
AST: 136 U/L — ABNORMAL HIGH (ref 15–41)
Alkaline Phosphatase: 1514 U/L — ABNORMAL HIGH (ref 38–126)
BUN: 15 mg/dL (ref 6–20)
CHLORIDE: 104 mmol/L (ref 101–111)
CO2: 25 mmol/L (ref 22–32)
CREATININE: 1.58 mg/dL — AB (ref 0.61–1.24)
Calcium: 8.4 mg/dL — ABNORMAL LOW (ref 8.9–10.3)
GFR calc non Af Amer: 50 mL/min — ABNORMAL LOW (ref >60)
GFR, EST AFRICAN AMERICAN: 58 mL/min — AB (ref >60)
Glucose, Bld: 97 mg/dL (ref 65–99)
POTASSIUM: 3.8 mmol/L (ref 3.5–5.1)
SODIUM: 134 mmol/L — AB (ref 135–145)
Total Bilirubin: 11.7 mg/dL — ABNORMAL HIGH (ref 0.3–1.2)
Total Protein: 7.1 g/dL (ref 6.5–8.1)

## 2015-11-28 LAB — URINE MICROSCOPIC-ADD ON

## 2015-11-28 LAB — LIPASE, BLOOD: LIPASE: 18 U/L (ref 11–51)

## 2015-11-28 MED ORDER — IOPAMIDOL (ISOVUE-300) INJECTION 61%
INTRAVENOUS | Status: AC
  Administered 2015-11-28: 100 mL
  Filled 2015-11-28: qty 100

## 2015-11-28 MED ORDER — SODIUM CHLORIDE 0.9 % IV SOLN
Freq: Once | INTRAVENOUS | Status: AC
  Administered 2015-11-28: 21:00:00 via INTRAVENOUS

## 2015-11-28 MED ORDER — SODIUM CHLORIDE 0.9 % IV SOLN: Freq: Once | INTRAVENOUS | Status: DC

## 2015-11-28 NOTE — ED Provider Notes (Signed)
CSN: [id number]     Arrival date & time 11/28/15  1422 History   First MD Initiated Contact with Patient 11/28/15 2016     Chief Complaint  Patient presents with  . Back Pain     (Consider location/radiation/quality/duration/timing/severity/associated sxs/prior Treatment) HPI Comments: 50 year old male with low back pain that he's had for quite a while insulin-dependent diabetes, status post cholecystectomy presents with exacerbation of his low back pain and yellowing of the sclerae of his eyes, nausea with occasional vomiting and intermittent abdominal pain. He states he was seen by his primary care physician approximately 2 weeks ago because of the yellowing of his sclera.  He was sent for an ultrasound of his liver, which showed fatty liver.  He was noted to have elevated LFTs at that time and was referred to an unknown physician for follow-up.  He is not followed up as of yet. He states he does not drink alcohol on a regular basis.  He does not use any Tylenol or NSAIDs on a regular basis.  He denies any black stools.  He does report that his urine is very dark in color  Patient is a 50 y.o. male presenting with back pain. The history is provided by the patient.  Back Pain Location:  Lumbar spine Quality:  Aching Radiates to:  Does not radiate Pain severity:  Moderate Onset quality:  Gradual Timing:  Intermittent Progression:  Unchanged Relieved by:  Nothing Worsened by:  Lying down Associated symptoms: abdominal pain   Associated symptoms: no chest pain, no dysuria, no fever and no headaches     Past Medical History  Diagnosis Date  . Diabetes mellitus without complication (HCC)   . Hypertension    Past Surgical History  Procedure Laterality Date  . Colonoscopy    . Cholecystectomy N/A 06/21/2014    Procedure: LAPAROSCOPIC CHOLECYSTECTOMY WITH INTRAOPERATIVE CHOLANGIOGRAM;  Surgeon: Chevis Pretty III, MD;  Location: [facility];  Service: General;  Laterality: N/A;   Family History   Problem Relation Age of Onset  . Diabetes Mother   . Diabetes Sister   . Diabetes Brother    Social History  Substance Use Topics  . Smoking status: Light Tobacco Smoker    Types: Cigarettes  . Smokeless tobacco: Never Used  . Alcohol Use: Yes     Comment: 4-5 beers a week    Review of Systems  Constitutional: Negative for fever.  Eyes: Negative for visual disturbance.  Respiratory: Negative for cough and shortness of breath.   Cardiovascular: Negative for chest pain.  Gastrointestinal: Positive for nausea, vomiting and abdominal pain. Negative for diarrhea, constipation and abdominal distention.  Genitourinary: Negative for dysuria and frequency.  Musculoskeletal: Positive for back pain.  Neurological: Negative for dizziness and headaches.  All other systems reviewed and are negative.     Allergies  Penicillins and Shrimp  Home Medications   Prior to Admission medications   Medication Sig Start Date End Date Taking? Authorizing Provider  aspirin EC 81 MG tablet Take 81 mg by mouth daily.    Historical Provider, MD  aspirin-acetaminophen-caffeine (EXCEDRIN MIGRAINE) [phone] MG tablet Take 2 tablets by mouth every 6 (six) hours as needed for headache.    Historical Provider, MD  atorvastatin (LIPITOR) 40 MG tablet Take 40 mg by mouth at bedtime.    Historical Provider, MD  insulin NPH Human (NOVOLIN N) 100 UNIT/ML injection Inject 18U in qAM and 20U qPM 06/22/14   [name], MD  insulin regular (NOVOLIN R) 100  units/mL injection Inject 7U before lunch 06/22/14   Alysia PennaScott Holwerda, MD  metFORMIN (GLUCOPHAGE) 1000 MG tablet Take 1 tab bid starting with first dosage on evening of 2/10 06/22/14   Alysia PennaScott Holwerda, MD  ondansetron (ZOFRAN) 4 MG tablet Take 1 tablet (4 mg total) by mouth every 6 (six) hours as needed for nausea. 06/22/14   Alysia PennaScott Holwerda, MD  tetrahydrozoline-zinc (VISINE-AC) 0.05-0.25 % ophthalmic solution Place 2 drops into both eyes 3 (three) times daily as needed  (eye irritation).    Historical Provider, MD  traMADol (ULTRAM) 50 MG tablet Take 1 tablet (50 mg total) by mouth every 6 (six) hours as needed for moderate pain. 04/25/15   Dione Boozeavid Glick, MD   BP 168/96 mmHg  Pulse 89  Temp(Src) 98.5 F (36.9 C) (Oral)  Resp 19  Ht 5\' 7"  (1.702 m)  Wt 95.255 kg  BMI 32.88 kg/m2  SpO2 99% Physical Exam  Constitutional: He appears well-developed and well-nourished.  HENT:  Head: Normocephalic.  Eyes: Pupils are equal, round, and reactive to light. Scleral icterus is present.  Neck: Normal range of motion.  Cardiovascular: Normal rate and regular rhythm.   Pulmonary/Chest: Effort normal and breath sounds normal.  Abdominal: Soft. Bowel sounds are normal. He exhibits no distension. There is no tenderness.  Musculoskeletal: He exhibits no tenderness.  Neurological: He is alert.  Skin: Skin is warm.  Nursing note and vitals reviewed.   ED Course  Procedures (including critical care time) Labs Review Labs Reviewed  COMPREHENSIVE METABOLIC PANEL - Abnormal; Notable for the following:    Sodium 134 (*)    Creatinine, Ser 1.58 (*)    Calcium 8.4 (*)    Albumin 2.1 (*)    AST 136 (*)    ALT 104 (*)    Alkaline Phosphatase 1514 (*)    Total Bilirubin 11.7 (*)    GFR calc non Af Amer 50 (*)    GFR calc Af Amer 58 (*)    All other components within normal limits  CBC - Abnormal; Notable for the following:    RBC 3.81 (*)    Hemoglobin 10.9 (*)    HCT 31.7 (*)    RDW 17.4 (*)    All other components within normal limits  URINALYSIS, ROUTINE W REFLEX MICROSCOPIC (NOT AT [facility]) - Abnormal; Notable for the following:    Color, Urine AMBER (*)    APPearance CLOUDY (*)    Glucose, UA 100 (*)    Hgb urine dipstick MODERATE (*)    Bilirubin Urine LARGE (*)    Ketones, ur 15 (*)    Protein, ur >300 (*)    Leukocytes, UA SMALL (*)    All other components within normal limits  URINE MICROSCOPIC-ADD ON - Abnormal; Notable for the following:     Squamous Epithelial / LPF 0-5 (*)    Bacteria, UA RARE (*)    Casts HYALINE CASTS (*)    All other components within normal limits  HEPATIC FUNCTION PANEL - Abnormal; Notable for the following:    Albumin 2.1 (*)    AST 135 (*)    ALT 105 (*)    Alkaline Phosphatase 1496 (*)    Total Bilirubin 11.4 (*)    Bilirubin, Direct 7.0 (*)    Indirect Bilirubin 4.4 (*)    All other components within normal limits  LIPASE, BLOOD  HEPATITIS PANEL, ACUTE    Imaging Review Ct Abdomen Pelvis W Contrast  11/28/2015  CLINICAL DATA:  Nausea and  lower back pain for 1 month. Elevated liver enzymes. EXAM: CT ABDOMEN AND PELVIS WITH CONTRAST TECHNIQUE: Multidetector CT imaging of the abdomen and pelvis was performed using the standard protocol following bolus administration of intravenous contrast. CONTRAST:  ISOVUE-300 IOPAMIDOL (ISOVUE-300) INJECTION 61% COMPARISON:  06/18/2014 FINDINGS: Lower chest and abdominal wall:  No contributory findings. Hepatobiliary: Cirrhotic liver morphology with surface nodularity, caudate enlargement, hepatic artery hypertrophy, and broad fissures. No evidence of mass lesion. Superimposed low-density is presumably steatosis. Contrast timing limits assessment of the portal venous system. No convincing thrombosis. No splenomegaly or ascites.Cholecystectomy with negative common bile duct. Pancreas: Unremarkable. Spleen: Unremarkable. Adrenals/Urinary Tract: Negative adrenals. No hydronephrosis or stone. Unremarkable bladder. Stomach/Bowel:  No obstruction. No appendicitis. Reproductive:No pathologic findings. Vascular/Lymphatic: No acute vascular abnormality. Atherosclerotic calcification of the aorta and iliacs. No mass or adenopathy. Other: Trace pelvic fluid. Musculoskeletal: No acute abnormalities. Chronic left L5 pars defect. IMPRESSION: 1. Cirrhotic liver morphology.  Correlate with risk factors. 2. Cholecystectomy.  No evidence of biliary obstruction. Electronically Signed    By: Marnee Spring M.D.   On: 11/28/2015 23:13   I have personally reviewed and evaluated these images and lab results as part of my medical decision-making.   EKG Interpretation None     I spoke with Dr. Rae Lips from GI who requested the patient have a CT scan.  He is concerned for a liver mass and patient be admitted to medicine with a consult in the morning CT scan does not show any mass lesion or abnormality within the abdominal cavity.  Patient is awaiting hepatitis screen.  We'll admit for IV fluids.  Hyperbilirubinemia MDM   Final diagnoses:  Elevated LFTs  Serum total bilirubin elevated  Jaundice         Earley Favor, NP 11/28/15 2350  Earley Favor, NP 11/29/15 1324  Geoffery Lyons, MD 11/29/15 1515

## 2015-11-28 NOTE — ED Notes (Signed)
Pt taken to CT.

## 2015-11-28 NOTE — ED Notes (Signed)
Pt c/o of left sided lower back pain for the last  2 weeks. Pt also reports dark colored urine and pt has jaundice eyes. Pt reports feeling nauseous but denies any v/d.denis abd.

## 2015-11-28 NOTE — ED Notes (Signed)
Patient states he's been sleeping on the floor to try and relieve back pain.

## 2015-11-29 ENCOUNTER — Encounter (HOSPITAL_COMMUNITY): Payer: Self-pay | Admitting: Family Medicine

## 2015-11-29 DIAGNOSIS — E440 Moderate protein-calorie malnutrition: Secondary | ICD-10-CM | POA: Insufficient documentation

## 2015-11-29 DIAGNOSIS — E785 Hyperlipidemia, unspecified: Secondary | ICD-10-CM

## 2015-11-29 DIAGNOSIS — K7460 Unspecified cirrhosis of liver: Secondary | ICD-10-CM | POA: Diagnosis present

## 2015-11-29 DIAGNOSIS — I10 Essential (primary) hypertension: Secondary | ICD-10-CM | POA: Diagnosis not present

## 2015-11-29 DIAGNOSIS — N289 Disorder of kidney and ureter, unspecified: Secondary | ICD-10-CM

## 2015-11-29 DIAGNOSIS — M545 Low back pain: Secondary | ICD-10-CM

## 2015-11-29 DIAGNOSIS — R7989 Other specified abnormal findings of blood chemistry: Secondary | ICD-10-CM | POA: Diagnosis present

## 2015-11-29 DIAGNOSIS — R945 Abnormal results of liver function studies: Secondary | ICD-10-CM

## 2015-11-29 DIAGNOSIS — E119 Type 2 diabetes mellitus without complications: Secondary | ICD-10-CM

## 2015-11-29 DIAGNOSIS — D649 Anemia, unspecified: Secondary | ICD-10-CM

## 2015-11-29 DIAGNOSIS — R17 Unspecified jaundice: Secondary | ICD-10-CM | POA: Insufficient documentation

## 2015-11-29 DIAGNOSIS — K7469 Other cirrhosis of liver: Secondary | ICD-10-CM

## 2015-11-29 DIAGNOSIS — G8929 Other chronic pain: Secondary | ICD-10-CM

## 2015-11-29 DIAGNOSIS — Z794 Long term (current) use of insulin: Secondary | ICD-10-CM

## 2015-11-29 DIAGNOSIS — I160 Hypertensive urgency: Secondary | ICD-10-CM

## 2015-11-29 DIAGNOSIS — IMO0001 Reserved for inherently not codable concepts without codable children: Secondary | ICD-10-CM | POA: Insufficient documentation

## 2015-11-29 LAB — GLUCOSE, CAPILLARY
GLUCOSE-CAPILLARY: 121 mg/dL — AB (ref 65–99)
GLUCOSE-CAPILLARY: 113 mg/dL — AB (ref 65–99)
GLUCOSE-CAPILLARY: 144 mg/dL — AB (ref 65–99)
GLUCOSE-CAPILLARY: 124 mg/dL — AB (ref 65–99)
Glucose-Capillary: 126 mg/dL — ABNORMAL HIGH (ref 65–99)

## 2015-11-29 LAB — COMPREHENSIVE METABOLIC PANEL
ALBUMIN: 1.7 g/dL — AB (ref 3.5–5.0)
ALK PHOS: 1361 U/L — AB (ref 38–126)
ALT: 92 U/L — AB (ref 17–63)
ANION GAP: 8 (ref 5–15)
AST: 120 U/L — ABNORMAL HIGH (ref 15–41)
BILIRUBIN TOTAL: 10.8 mg/dL — AB (ref 0.3–1.2)
BUN: 13 mg/dL (ref 6–20)
CALCIUM: 8.1 mg/dL — AB (ref 8.9–10.3)
CO2: 25 mmol/L (ref 22–32)
CREATININE: 1.33 mg/dL — AB (ref 0.61–1.24)
Chloride: 102 mmol/L (ref 101–111)
GFR calc Af Amer: >60 mL/min (ref >60)
GFR calc non Af Amer: >60 mL/min (ref >60)
GLUCOSE: 112 mg/dL — AB (ref 65–99)
Potassium: 3.3 mmol/L — ABNORMAL LOW (ref 3.5–5.1)
SODIUM: 135 mmol/L (ref 135–145)
TOTAL PROTEIN: 6 g/dL — AB (ref 6.5–8.1)

## 2015-11-29 LAB — RETICULOCYTES
RBC.: 3.55 MIL/uL — AB (ref 4.22–5.81)
RETIC COUNT ABSOLUTE: 56.8 10*3/uL (ref 19.0–186.0)
RETIC CT PCT: 1.6 % (ref 0.4–3.1)

## 2015-11-29 LAB — IRON AND TIBC
Iron: 50 ug/dL (ref 45–182)
Saturation Ratios: 32 % (ref 17.9–39.5)
TIBC: 155 ug/dL — AB (ref 250–450)
UIBC: 105 ug/dL

## 2015-11-29 LAB — FERRITIN: Ferritin: 2109 ng/mL — ABNORMAL HIGH (ref 24–336)

## 2015-11-29 LAB — VITAMIN B12: VITAMIN B 12: 584 pg/mL (ref 180–914)

## 2015-11-29 LAB — PROTIME-INR
INR: 1.51 — ABNORMAL HIGH (ref 0.00–1.49)
PROTHROMBIN TIME: 18.3 s — AB (ref 11.6–15.2)

## 2015-11-29 MED ORDER — ASPIRIN EC 81 MG PO TBEC
81.0000 mg | DELAYED_RELEASE_TABLET | Freq: Every day | ORAL | Status: DC
  Administered 2015-11-29 – 2015-11-30 (×2): 81 mg via ORAL
  Filled 2015-11-29 (×2): qty 1

## 2015-11-29 MED ORDER — POLYETHYLENE GLYCOL 3350 17 G PO PACK: 17.0000 g | PACK | Freq: Every day | ORAL | Status: DC | PRN

## 2015-11-29 MED ORDER — INSULIN ASPART 100 UNIT/ML ~~LOC~~ SOLN
0.0000 [IU] | Freq: Three times a day (TID) | SUBCUTANEOUS | Status: DC
  Administered 2015-11-29 – 2015-12-01 (×3): 1 [IU] via SUBCUTANEOUS

## 2015-11-29 MED ORDER — HYDRALAZINE HCL 20 MG/ML IJ SOLN
10.0000 mg | INTRAMUSCULAR | Status: DC | PRN
  Administered 2015-11-29 – 2015-11-30 (×2): 10 mg via INTRAVENOUS
  Filled 2015-11-29 (×2): qty 1

## 2015-11-29 MED ORDER — ACETAMINOPHEN 650 MG RE SUPP: 650.0000 mg | Freq: Four times a day (QID) | RECTAL | Status: DC | PRN

## 2015-11-29 MED ORDER — ONDANSETRON HCL 4 MG/2ML IJ SOLN: 4.0000 mg | Freq: Four times a day (QID) | INTRAMUSCULAR | Status: DC | PRN

## 2015-11-29 MED ORDER — SODIUM CHLORIDE 0.9% FLUSH
3.0000 mL | Freq: Two times a day (BID) | INTRAVENOUS | Status: DC
  Administered 2015-11-29 – 2015-12-01 (×6): 3 mL via INTRAVENOUS

## 2015-11-29 MED ORDER — SODIUM CHLORIDE 0.9% FLUSH: 3.0000 mL | INTRAVENOUS | Status: DC | PRN

## 2015-11-29 MED ORDER — POTASSIUM CHLORIDE CRYS ER 20 MEQ PO TBCR
40.0000 meq | EXTENDED_RELEASE_TABLET | Freq: Once | ORAL | Status: AC
  Administered 2015-11-29: 40 meq via ORAL
  Filled 2015-11-29: qty 2

## 2015-11-29 MED ORDER — ONDANSETRON HCL 4 MG PO TABS: 4.0000 mg | ORAL_TABLET | Freq: Four times a day (QID) | ORAL | Status: DC | PRN

## 2015-11-29 MED ORDER — BISACODYL 5 MG PO TBEC: 5.0000 mg | DELAYED_RELEASE_TABLET | Freq: Every day | ORAL | Status: DC | PRN

## 2015-11-29 MED ORDER — HEPARIN SODIUM (PORCINE) 5000 UNIT/ML IJ SOLN
5000.0000 [IU] | Freq: Three times a day (TID) | INTRAMUSCULAR | Status: DC
  Administered 2015-11-29 – 2015-11-30 (×5): 5000 [IU] via SUBCUTANEOUS
  Filled 2015-11-29 (×5): qty 1

## 2015-11-29 MED ORDER — AMLODIPINE BESYLATE 10 MG PO TABS
10.0000 mg | ORAL_TABLET | Freq: Every day | ORAL | Status: DC
  Administered 2015-11-29 – 2015-12-02 (×4): 10 mg via ORAL
  Filled 2015-11-29 (×5): qty 1

## 2015-11-29 MED ORDER — HYDROCODONE-ACETAMINOPHEN 5-325 MG PO TABS: 1.0000 | ORAL_TABLET | ORAL | Status: DC | PRN

## 2015-11-29 MED ORDER — ACETAMINOPHEN 325 MG PO TABS
650.0000 mg | ORAL_TABLET | Freq: Four times a day (QID) | ORAL | Status: DC | PRN
  Administered 2015-11-29: 650 mg via ORAL
  Filled 2015-11-29: qty 2

## 2015-11-29 MED ORDER — SODIUM CHLORIDE 0.9 % IV SOLN: 250.0000 mL | INTRAVENOUS | Status: DC | PRN

## 2015-11-29 NOTE — Progress Notes (Signed)
PROGRESS NOTE  Adam Baker  ZOX:[id number] DOB: 05/01/1966  DOA: 11/28/2015 PCP: Alysia PennaHOLWERDA, SCOTT, MD   Brief Narrative:  50 year old male with PMH of DM 2, HTN, [facility] ED on 11/28/15 with complaints of acute on chronic low back pain of 2 weeks' duration, yellowish discoloration of urine and eyes also of 2 weeks' duration, and noted to have markedly abnormal LFTs with hyperbilirubinemia. [facility] consulted.   Assessment & Plan:   Principal Problem:   Elevated liver function tests Active Problems:   DM (diabetes mellitus), type 2, uncontrolled (HCC)   Essential hypertension   Hyperlipidemia   Kidney disease   Normocytic anemia   Cirrhosis (HCC)   Elevated LFTs   Chronic low back pain   Jaundice   Insulin dependent diabetes mellitus (HCC)   Hypertensive urgency   Malnutrition of moderate degree   Chronic liver disease/likely cirrhosis - Patient drinks socially and occasionally. No prior history of hepatitis, blood transfusion, IV drug abuse. - CT abdomen and pelvis confirms cirrhosis. No evidence of biliary obstruction. Status post cholecystectomy. Deboraha Sprang- [facility] consultation appreciated. 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 GI has ordered workup. If her defined liver condition is confirmed serologically, obtain hepatic elastography to assess degree of fibrosis. If blood work is nondistended, would favor liver biopsy while in house to try to obtain diagnosis. - GI will follow. Discussed with Dr.Buccini - Has coagulopathy: INR 1.51.  Essential hypertension - Presented with hypertensive urgency and initial blood pressure of 200/120 in ED. - Lisinopril held given concern regarding kidney disease and started amlodipine 10 MG daily. Monitor.  DM 2/IDDM - Reasonable inpatient control on SSI  Acute on chronic low back pain - Uncertain etiology. Likely  musculoskeletal. No concerning features or acute findings on CT. Chronic L5 defect noted on CT. Acute pain seems to have resolved. Patient seen ambulating comfortably in the halls.  Normocytic anemia - Likely of chronic disease. Follow CBCs.  Hypokalemia - Replace and follow.  Stage II-III chronic kidney disease - Baseline creatinine not known. Admitted with creatinine of 1.58 which has improved to 1.53. Follow BMP.   DVT prophylaxis: Subcutaneous heparin Code Status: Full Family Communication: Discussed with patient. No family at bedside. Disposition Plan: DC home when clinically improved   Consultants:   [facility]  Procedures:   None  Antimicrobials:   None    Subjective: No back pain reported. Gives 2 weeks history of yellowish discoloration of eyes and urine.  Objective:  Filed Vitals:   11/29/15 0630 11/29/15 1049 11/29/15 1459 11/29/15 1756  BP: 164/81 158/99 161/96 161/97  Pulse: 87 87 87 88  Temp: 98.9 F (37.2 C) 98.3 F (36.8 C) 98.9 F (37.2 C) 98.4 F (36.9 C)  TempSrc: Oral Oral Oral Oral  Resp: 19 18 20 18   Height:      Weight:      SpO2: 100% 100% 99% 100%    Intake/Output Summary (Last 24 hours) at 11/29/15 1823 Last data filed at 11/29/15 1800  Gross per 24 hour  Intake    720 ml  Output      0 ml  Net    720 ml   Filed Weights   11/28/15 1446 11/29/15 0128  Weight: 95.255 kg (210 lb) 86.909 kg (191 lb 9.6 oz)    Examination:  General exam: Pleasant young male seen ambulating comfortably in the halls. Scleral icterus. Respiratory system: Clear  to auscultation. Respiratory effort normal. Cardiovascular system: S1 & S2 heard, RRR. No JVD, murmurs, rubs, gallops or clicks. No pedal edema. Gastrointestinal system: Abdomen is nondistended, soft and nontender. No organomegaly or masses felt. Normal bowel sounds heard. Central nervous system: Alert and oriented. No focal neurological deficits. Extremities: Symmetric 5 x 5 power. Skin:  No rashes, lesions or ulcers Psychiatry: Judgement and insight appear normal. Mood & affect appropriate.     Data Reviewed: I have personally reviewed following labs and imaging studies  CBC:  Recent Labs Lab 11/28/15 1449  WBC 10.5  HGB 10.9*  HCT 31.7*  MCV 83.2  PLT 228   Basic Metabolic Panel:  Recent Labs Lab 11/28/15 1449 11/29/15 0404  NA 134* 135  K 3.8 3.3*  CL 104 102  CO2 25 25  GLUCOSE 97 112*  BUN 15 13  CREATININE 1.58* 1.33*  CALCIUM 8.4* 8.1*   GFR: Estimated Creatinine Clearance: 70.7 mL/min (by C-G formula based on Cr of 1.33). Liver Function Tests:  Recent Labs Lab 11/28/15 1449 11/29/15 0404  AST 135*  136* 120*  ALT 105*  104* 92*  ALKPHOS 1496*  1514* 1361*  BILITOT 11.4*  11.7* 10.8*  PROT 7.0  7.1 6.0*  ALBUMIN 2.1*  2.1* 1.7*    Recent Labs Lab 11/28/15 1449  LIPASE 18   No results for input(s): AMMONIA in the last 168 hours. Coagulation Profile:  Recent Labs Lab 11/29/15 1048  INR 1.51*   Cardiac Enzymes: No results for input(s): CKTOTAL, CKMB, CKMBINDEX, TROPONINI in the last 168 hours. BNP (last 3 results) No results for input(s): PROBNP in the last 8760 hours. HbA1C: No results for input(s): HGBA1C in the last 72 hours. CBG:  Recent Labs Lab 11/29/15 0210 11/29/15 0600 11/29/15 1145 11/29/15 1645  GLUCAP 121* 113* 126* 144*   Lipid Profile: No results for input(s): CHOL, HDL, LDLCALC, TRIG, CHOLHDL, LDLDIRECT in the last 72 hours. Thyroid Function Tests: No results for input(s): TSH, T4TOTAL, FREET4, T3FREE, THYROIDAB in the last 72 hours. Anemia Panel:  Recent Labs  11/29/15 0404  VITAMINB12 584  FERRITIN 2109*  TIBC 155*  IRON 50  RETICCTPCT 1.6    Sepsis Labs: No results for input(s): PROCALCITON, LATICACIDVEN in the last 168 hours.  No results found for this or any previous visit (from the past 240 hour(s)).       Radiology Studies: Ct Abdomen Pelvis W Contrast  11/28/2015   CLINICAL DATA:  Nausea and lower back pain for 1 month. Elevated liver enzymes. EXAM: CT ABDOMEN AND PELVIS WITH CONTRAST TECHNIQUE: Multidetector CT imaging of the abdomen and pelvis was performed using the standard protocol following bolus administration of intravenous contrast. CONTRAST:  ISOVUE-300 IOPAMIDOL (ISOVUE-300) INJECTION 61% COMPARISON:  06/18/2014 FINDINGS: Lower chest and abdominal wall:  No contributory findings. Hepatobiliary: Cirrhotic liver morphology with surface nodularity, caudate enlargement, hepatic artery hypertrophy, and broad fissures. No evidence of mass lesion. Superimposed low-density is presumably steatosis. Contrast timing limits assessment of the portal venous system. No convincing thrombosis. No splenomegaly or ascites.Cholecystectomy with negative common bile duct. Pancreas: Unremarkable. Spleen: Unremarkable. Adrenals/Urinary Tract: Negative adrenals. No hydronephrosis or stone. Unremarkable bladder. Stomach/Bowel:  No obstruction. No appendicitis. Reproductive:No pathologic findings. Vascular/Lymphatic: No acute vascular abnormality. Atherosclerotic calcification of the aorta and iliacs. No mass or adenopathy. Other: Trace pelvic fluid. Musculoskeletal: No acute abnormalities. Chronic left L5 pars defect. IMPRESSION: 1. Cirrhotic liver morphology.  Correlate with risk factors. 2. Cholecystectomy.  No evidence of biliary obstruction. Electronically  Signed   By: Marnee Spring M.D.   On: 11/28/2015 23:13        Scheduled Meds: . amLODipine  10 mg Oral Daily  . aspirin EC  81 mg Oral Daily  . heparin  5,000 Units Subcutaneous Q8H  . insulin aspart  0-9 Units Subcutaneous TID WC  . sodium chloride flush  3 mL Intravenous Q12H   Continuous Infusions:       Time spent: 40 minutes.    [facility], MD [facility] Pager [phone]  If 7PM-7AM, please contact night-coverage [email or web address] Password [facility] 11/29/2015, 6:23 PM

## 2015-11-29 NOTE — Consult Note (Signed)
Referring Provider:  Dr. Tera Partridge Primary Care Physician:  Velna Hatchet, MD Primary Gastroenterologist:  None (unassigned)  Reason for Consultation:  Liver disease (newly-recognized)  HPI: Adam Baker is a 50 y.o. male who presented to the ER last night for back pain, and was incidentally found to have jaundice and marked elevation of LFT's (T Bili 11.7, Alk Phos 1514, AST 136,ALT 104).  Albumin is low at 2.1, and INR is slightly out at 1.51, although platelets are nl at 228K.  The patient has a several-year h/o DM, w/ hepatic steatosis noted on u/s 05/30/2015; on CT this admission, pt has evid of cirrhotic changes, w/ nodular contour to liver surface and enlargement of the caudate lobe of the liver, but no splenomegaly or ascites.  Risk factors for liver disease, apart from DM for NASH, are absent. No chronic meds other than insulin and (previously) lisinopril; no signif EtOH (2-3 beers/month, no h/o prior heavy consumption), no fam hx of liver dis (other than in 1 uncle who was alcoholic).  Pt had mild elev of LFT's 06/2014 at time of cholecystectomy: TB was nl at 0.8 but alk phos was 329, AST 67, ALT 98.   Liver apparently had unremarkable appearance at time of that surgery (per review of op note), and IOC showed unremarkable biliary tree.  Labs so far show nl Fe saturation (thereby ruling out hemachromatosis); Hepatitis panel is pending.  Pt feels ok--no overt fatigue, no flu-like sx or arthralgias to suggest autoimmune hepatitis.  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  Also, believes he's had weight loss of about 15 lbs over the past 6 mos since last seen at PCP but w/out overt anorexia or marked decrease in food intake.  Pt has recently noticed dark urine and pruritis.  Per d/w PCP, pt has had spotty clinic f/u, with frequent no-shows and cancellations, apparently largely due to expense and insurance reasons.  Has had  previous A1-C of 9 although more recent one was better.    Past Medical History  Diagnosis Date  . Diabetes mellitus without complication ([place])   . Hypertension     Past Surgical History  Procedure Laterality Date  . Colonoscopy    . Cholecystectomy N/A 06/21/2014    Procedure: LAPAROSCOPIC CHOLECYSTECTOMY WITH INTRAOPERATIVE CHOLANGIOGRAM;  Surgeon: Autumn Messing III, MD;  Location: [facility];  Service: General;  Laterality: N/A;    Prior to Admission medications   Medication Sig Start Date End Date Taking? Authorizing Provider  aspirin EC 81 MG tablet Take 81 mg by mouth daily.   Yes Historical Provider, MD  insulin NPH Human (NOVOLIN N) 100 UNIT/ML injection Inject 18U in qAM and 20U qPM Patient taking differently: Inject 20-22 Units into the skin 2 (two) times daily before a meal. Take 20 units every morning Take 22 units every evening 06/22/14  Yes Velna Hatchet, MD  insulin regular (NOVOLIN R) 100 units/mL injection Inject 7U before lunch Patient taking differently: Inject 5 Units into the skin 2 (two) times daily before a meal.  06/22/14  Yes Velna Hatchet, MD  lisinopril (PRINIVIL,ZESTRIL) 20 MG tablet Take 20 mg by mouth daily.   Yes Historical Provider, MD  metFORMIN (GLUCOPHAGE) 1000 MG tablet Take 1,000 mg by mouth 2 (two) times daily with a meal.   Yes Historical Provider, MD  metFORMIN (GLUCOPHAGE) 1000 MG tablet Take 1 tab bid starting with first dosage on evening of 2/10 Patient not taking: Reported  on 11/29/2015 06/22/14   Velna Hatchet, MD    Current Facility-Administered Medications  Medication Dose Route Frequency Provider Last Rate Last Dose  . 0.9 %  sodium chloride infusion  250 mL Intravenous PRN Vianne Bulls, MD      . acetaminophen (TYLENOL) tablet 650 mg  650 mg Oral Q6H PRN Vianne Bulls, MD       Or  . acetaminophen (TYLENOL) suppository 650 mg  650 mg Rectal Q6H PRN Vianne Bulls, MD      . amLODipine (NORVASC) tablet 10 mg  10 mg Oral Daily Vianne Bulls,  MD   10 mg at 11/29/15 0220  . aspirin EC tablet 81 mg  81 mg Oral Daily Vianne Bulls, MD   81 mg at 11/29/15 0943  . bisacodyl (DULCOLAX) EC tablet 5 mg  5 mg Oral Daily PRN Vianne Bulls, MD      . heparin injection 5,000 Units  5,000 Units Subcutaneous Q8H Vianne Bulls, MD   5,000 Units at 11/29/15 1455  . hydrALAZINE (APRESOLINE) injection 10 mg  10 mg Intravenous Q4H PRN Vianne Bulls, MD   10 mg at 11/29/15 0036  . HYDROcodone-acetaminophen (NORCO/VICODIN) 5-325 MG per tablet 1-2 tablet  1-2 tablet Oral Q4H PRN Ilene Qua Opyd, MD      . insulin aspart (novoLOG) injection 0-9 Units  0-9 Units Subcutaneous TID WC Vianne Bulls, MD   1 Units at 11/29/15 1300  . ondansetron (ZOFRAN) tablet 4 mg  4 mg Oral Q6H PRN Vianne Bulls, MD       Or  . ondansetron (ZOFRAN) injection 4 mg  4 mg Intravenous Q6H PRN Ilene Qua Opyd, MD      . polyethylene glycol (MIRALAX / GLYCOLAX) packet 17 g  17 g Oral Daily PRN Ilene Qua Opyd, MD      . sodium chloride flush (NS) 0.9 % injection 3 mL  3 mL Intravenous Q12H Vianne Bulls, MD   3 mL at 11/29/15 0944  . sodium chloride flush (NS) 0.9 % injection 3 mL  3 mL Intravenous PRN Vianne Bulls, MD        Allergies as of 11/28/2015 - Review Complete 11/28/2015  Allergen Reaction Noted  . Penicillins Nausea Only 06/18/2014  . Shrimp [shellfish allergy] Nausea And Vomiting 06/18/2014    Family History  Problem Relation Age of Onset  . Diabetes Mother   . Diabetes Sister   . Diabetes Brother     Social History   Social History  . Marital Status: Single    Spouse Name: N/A  . Number of Children: N/A  . Years of Education: N/A   Occupational History  . Not on file.   Social History Main Topics  . Smoking status: Light Tobacco Smoker    Types: Cigarettes  . Smokeless tobacco: Never Used  . Alcohol Use: Yes     Comment: 4-5 beers a week  . Drug Use: No  . Sexual Activity: Not on file   Other Topics Concern  . Not on file   Social  History Narrative    Review of Systems:  Neg for headaches, dysphagia/heartburn, chest pain or SOB, dysuria/hematuria, skin problems (other than recent itching), swollen lymph nodes, or arthritis (apart from back pain)  Physical Exam: Vital signs in last 24 hours: Temp:  [98.3 F (36.8 C)-99.2 F (37.3 C)] 98.9 F (37.2 C) (07/18 1459) Pulse Rate:  [55-91] 87 (07/18 1459) Resp:  [  18-22] 20 (07/18 1459) BP: (158-204)/(81-120) 161/96 mmHg (07/18 1459) SpO2:  [99 %-100 %] 99 % (07/18 1459) Weight:  [86.909 kg (191 lb 9.6 oz)] 86.909 kg (191 lb 9.6 oz) (07/18 0128) Last BM Date: 11/28/15 General:   Alert,  Well-developed, well-nourished, pleasant and cooperative in NAD Head:  Normocephalic and atraumatic. Eyes:  Sclerae quite icteric.   Conjunctiva pink. Mouth:   No ulcerations or lesions.  Oropharynx pink & moist. Neck:   No masses or thyromegaly. Lungs:  Clear throughout to auscultation.   No wheezes, crackles, or rhonchi. No evident respiratory distress. Heart:   Regular rate and rhythm; no murmurs, clicks, rubs,  or gallops. Abdomen:  Soft, nontender, nontympanitic, and nondistended. No masses, hepatosplenomegaly or ventral hernias noted.  Liver span by scratch test is about 8 cm. Msk:   Symmetrical without gross deformities. Extremities:   Without clubbing, cyanosis, or edema. Neurologic:  Alert and coherent;  grossly normal neurologically. No asterixis. Does serial 3's quickly and accurately. Skin:  Intact without significant lesions or rashes. Cervical Nodes:  No significant cervical adenopathy. Psych:   Alert and cooperative. Normal mood and affect.  Intake/Output from previous day:   Intake/Output this shift: Total I/O In: 240 [P.O.:240] Out: -   Lab Results:  Recent Labs  11/28/15 1449  WBC 10.5  HGB 10.9*  HCT 31.7*  PLT 228   BMET  Recent Labs  11/28/15 1449 11/29/15 0404  NA 134* 135  K 3.8 3.3*  CL 104 102  CO2 25 25  GLUCOSE 97 112*  BUN 15 13   CREATININE 1.58* 1.33*  CALCIUM 8.4* 8.1*   LFT  Recent Labs  11/28/15 1449 11/29/15 0404  PROT 7.0  7.1 6.0*  ALBUMIN 2.1*  2.1* 1.7*  AST 135*  136* 120*  ALT 105*  104* 92*  ALKPHOS 1496*  1514* 1361*  BILITOT 11.4*  11.7* 10.8*  BILIDIR 7.0*  --   IBILI 4.4*  --    PT/INR  Recent Labs  11/29/15 1048  LABPROT 18.3*  INR 1.51*    Studies/Results: Ct Abdomen Pelvis W Contrast  11/28/2015  CLINICAL DATA:  Nausea and lower back pain for 1 month. Elevated liver enzymes. EXAM: CT ABDOMEN AND PELVIS WITH CONTRAST TECHNIQUE: Multidetector CT imaging of the abdomen and pelvis was performed using the standard protocol following bolus administration of intravenous contrast. CONTRAST:  135m ISOVUE-300 IOPAMIDOL (ISOVUE-300) INJECTION 61% COMPARISON:  06/18/2014 FINDINGS: Lower chest and abdominal wall:  No contributory findings. Hepatobiliary: Cirrhotic liver morphology with surface nodularity, caudate enlargement, hepatic artery hypertrophy, and broad fissures. No evidence of mass lesion. Superimposed low-density is presumably steatosis. Contrast timing limits assessment of the portal venous system. No convincing thrombosis. No splenomegaly or ascites.Cholecystectomy with negative common bile duct. Pancreas: Unremarkable. Spleen: Unremarkable. Adrenals/Urinary Tract: Negative adrenals. No hydronephrosis or stone. Unremarkable bladder. Stomach/Bowel:  No obstruction. No appendicitis. Reproductive:No pathologic findings. Vascular/Lymphatic: No acute vascular abnormality. Atherosclerotic calcification of the aorta and iliacs. No mass or adenopathy. Other: Trace pelvic fluid. Musculoskeletal: No acute abnormalities. Chronic left L5 pars defect. IMPRESSION: 1. Cirrhotic liver morphology.  Correlate with risk factors. 2. Cholecystectomy.  No evidence of biliary obstruction. Electronically Signed   By: JMonte FantasiaM.D.   On: 11/28/2015 23:13    Impression: 1.  Chronic liver disease  (steatosis/steatohepatitis) with recent (past 18 mos) advancement in biochemical abnormalities and development of radiographic findings of significant fibrosis if not cirrhosis.  Etiology most likely aggressive steatohepatitis altho autoimmune diseases or Hep  C might present in this fashion. Predominant cholestatic picture brings to mind the possibility of autoimmune cholangiopathy.  Plan: 1. Await blood work checking for various liver diseases 2. If a defined liver condition is confirmed serologically, obtain hepatic elastography to assess degree of hepatic fibrosis.  If blood work is nondefinitive, would favor liver bx while in-house to try to obtain dx. 3. Hopefully liver condition will remain sufficiently stable to permit pt to have outpt eval at a Hepatology clinic for further mgt.     [place] V  11/29/2015, 3:21 PM   Pager [phone] If no answer or after 5 PM call [phone]

## 2015-11-29 NOTE — Progress Notes (Signed)
Initial Nutrition Assessment  DOCUMENTATION CODES:   Non-severe (moderate) malnutrition in context of acute illness/injury  INTERVENTION:  Provide Snacks TID Encourage healthful PO intake with vegetables, fruits, whole grains, beans, nuts and low fat dairy as well as lean protein. Encouraged low fat diet.    NUTRITION DIAGNOSIS:   Malnutrition related to nausea, acute illness as evidenced by energy intake < 75% for > 7 days, mild depletion of body fat, mild depletion of muscle mass, percent weight loss.   GOAL:   Patient will meet greater than or equal to 90% of their needs   MONITOR:   PO intake, Labs, Weight trends, Skin, I & O's  REASON FOR ASSESSMENT:   Malnutrition Screening Tool    ASSESSMENT:   50 y.o. male with medical history significant for hypertension and insulin-dependent diabetes mellitus who presents the emergency department for evaluation of acute on chronic low back pain. Patient endorses a long-standing history of chronic low back pain, but notes insidious worsening over the past 2 weeks. He does, however, endorse recent darkening of his urine and yellowing of his eyes. He had been evaluated by his PCP approximately one month ago for these complaints and was noted to have elevation in his LFTs.   Pt states that he has had nausea for the past month with occasional vomiting. He reports eating 25% less than usual during this time and losing from 208 lbs to 191 lbs within the past 2 months. He denies any nausea this morning and was able to eat 100% of breakfast. He states that he usually eats a biscuit for breakfast, salad and baked potato for lunch, and a sandwich for dinner. RD encouraged increasing intake of fruits and vegetables. Recommended light snacking between meals. Pt reports taking his insulin consistently and controlling blood sugars 80-190 mg/dL recently. Pt agreeable to receiving healthy snacks during admission.  Labs: low calcium, elevated AST/ALT, low  TIBC, high alk phos, high bilirubin  Diet Order:  Diet Carb Modified Fluid consistency:: Thin; Room service appropriate?: Yes  Skin:  Reviewed, no issues  Last BM:  7/18  Height:   Ht Readings from Last 1 Encounters:  11/29/15 '5\' 7"'  (1.702 m)    Weight:   Wt Readings from Last 1 Encounters:  11/29/15 191 lb 9.6 oz (86.909 kg)    Ideal Body Weight:  67.3 kg  BMI:  Body mass index is 30 kg/(m^2).  Estimated Nutritional Needs:   Kcal:  2100-2300  Protein:  80-90 grams  Fluid:  2.3 L/day  EDUCATION NEEDS:   No education needs identified at this time  [place], LDN Inpatient Clinical Dietitian Pager: [phone] After Hours Pager: [phone]

## 2015-11-29 NOTE — H&P (Signed)
History and Physical    Adam Baker ZOX:[id number] DOB: [birth year] DOA: 11/28/2015  PCP: [name], MD   Patient coming from: Home   Chief Complaint: Low back pain  HPI: Adam Baker is a 50 y.o. male with medical history significant for hypertension and insulin-dependent diabetes mellitus who presents the emergency department for evaluation of acute on chronic low back pain. Patient endorses a long-standing history of chronic low back pain, but notes insidious worsening over the past 2 weeks. Pain is localized to just left of midline in the lumbar region, worse with any activity or movement, and better with rest. He denies any recent weight loss, night sweats, fevers, chills, or dysuria. He does, however, endorse recent darkening of his urine and yellowing of his eyes. He had been evaluated by his PCP approximately one month ago for these complaints and was noted to have elevation in his LFTs. Abdominal ultrasound was obtained at that time and findings were consistent with hepatic steatosis. Patient was reportedly referred to a specialist, but states that the appointment had never been made. Patient denies any family history of hepatitis or liver disease. He denies any history of heavy alcohol use or any use of intravenous drugs. He denies any abdominal pain, but endorses some intermittent and mild nausea. He denies vomiting or diarrhea.  ED Course: Upon arrival to the ED, patient is found to be afebrile, saturating well on room air, hypertensive to 200/120, and with vitals otherwise stable. CMP is notable for alkaline phosphatase 1514, transaminases in the 100 range, and total bilirubin of 11.4. Also noted on CMP is a serum creatinine 1.58, up from 1.43 last year. CBC is notable for hemoglobin of 10.9 with normal MCV. Urinalysis features moderate hemoglobin, large bilirubin, and greater than 300 protein, but no suggestion of infection. Gastroenterologist was consulted by the ED provider and it  was advised that CT of the abdomen and pelvis be obtained, patient be observed in the hospital, and GI will plan to consult in the morning. CT of the abdomen and pelvis was performed and findings are consistent with hepatic cirrhosis and cholecystectomy without evidence for biliary obstruction. Patient was started on a gentle IV fluid hydration. Hydralazine IV push was administered for hypertension. Patient remained hemodynamically stable and will be observed on the medical-surgical unit for ongoing evaluation and management of painless jaundice with elevated LFTs.  Review of Systems:  All other systems reviewed and apart from HPI, are negative.  Past Medical History  Diagnosis Date  . Diabetes mellitus without complication (HCC)   . Hypertension     Past Surgical History  Procedure Laterality Date  . Colonoscopy    . Cholecystectomy N/A 06/21/2014    Procedure: LAPAROSCOPIC CHOLECYSTECTOMY WITH INTRAOPERATIVE CHOLANGIOGRAM;  Surgeon: Chevis Pretty III, MD;  Location: [facility];  Service: General;  Laterality: N/A;     reports that he has been smoking Cigarettes.  He has never used smokeless tobacco. He reports that he drinks alcohol. He reports that he does not use illicit drugs.  Allergies  Allergen Reactions  . Penicillins Nausea Only  . Shrimp [Shellfish Allergy] Nausea And Vomiting    Family History  Problem Relation Age of Onset  . Diabetes Mother   . Diabetes Sister   . Diabetes Brother      Prior to Admission medications   Medication Sig Start Date End Date Taking? Authorizing Provider  aspirin EC 81 MG tablet Take 81 mg by mouth daily.   Yes Historical Provider, MD  insulin NPH Human (NOVOLIN N) 100 UNIT/ML injection Inject 18U in qAM and 20U qPM Patient taking differently: No sig reported 06/22/14  Yes [name], MD  insulin regular (NOVOLIN R) 100 units/mL injection Inject 7U before lunch Patient taking differently: No sig reported 06/22/14  Yes [name], MD    LISINOPRIL PO Take 1 tablet by mouth daily.   Yes Historical Provider, MD  METFORMIN HCL PO Take 1 tablet by mouth daily.   Yes Historical Provider, MD  metFORMIN (GLUCOPHAGE) 1000 MG tablet Take 1 tab bid starting with first dosage on evening of 2/10 Patient not taking: Reported on 11/29/2015 06/22/14   [name], MD  ondansetron (ZOFRAN) 4 MG tablet Take 1 tablet (4 mg total) by mouth every 6 (six) hours as needed for nausea. Patient not taking: Reported on 11/29/2015 06/22/14   [name], MD  traMADol (ULTRAM) 50 MG tablet Take 1 tablet (50 mg total) by mouth every 6 (six) hours as needed for moderate pain. Patient not taking: Reported on 11/29/2015 04/25/15   Dione Booze, MD    Physical Exam: Filed Vitals:   11/29/15 0000 11/29/15 0032 11/29/15 0045 11/29/15 0128  BP: 168/96 197/119 181/114 190/100  Pulse: 89 86 88 91  Temp:    99.2 F (37.3 C)  TempSrc:    Oral  Resp: 19 22 20 19   Height:    5\' 7"  (1.702 m)  Weight:    86.909 kg (191 lb 9.6 oz)  SpO2: 99% 100% 100% 100%      Constitutional: NAD, calm, comfortable, jaundiced Eyes: PERTLA, lids and conjunctivae normal, scleral icterus ENMT: Mucous membranes are moist. Posterior pharynx clear of any exudate or lesions.   Neck: normal, supple, no masses, no thyromegaly Respiratory: clear to auscultation bilaterally, no wheezing, no crackles. Normal respiratory effort.   Cardiovascular: S1 & S2 heard, regular rate and rhythm, no significant murmurs / rubs / gallops. No extremity edema. No significant JVD. Abdomen: No distension, no tenderness, no masses palpated. Bowel sounds normal.  Musculoskeletal: no clubbing / cyanosis. No joint deformity upper and lower extremities. Normal muscle tone.  Skin: no significant rashes, lesions, ulcers. Warm, dry, well-perfused. Neurologic: CN 2-12 grossly intact. Sensation intact, DTR normal. Strength 5/5 in all 4 limbs.  Psychiatric: Normal judgment and insight. Alert and oriented x 3.  Normal mood and affect.     Labs on Admission: I have personally reviewed following labs and imaging studies  CBC:  Recent Labs Lab 11/28/15 1449  WBC 10.5  HGB 10.9*  HCT 31.7*  MCV 83.2  PLT 228   Basic Metabolic Panel:  Recent Labs Lab 11/28/15 1449  NA 134*  K 3.8  CL 104  CO2 25  GLUCOSE 97  BUN 15  CREATININE 1.58*  CALCIUM 8.4*   GFR: Estimated Creatinine Clearance: 59.5 mL/min (by C-G formula based on Cr of 1.58). Liver Function Tests:  Recent Labs Lab 11/28/15 1449  AST 135*  136*  ALT 105*  104*  ALKPHOS 1496*  1514*  BILITOT 11.4*  11.7*  PROT 7.0  7.1  ALBUMIN 2.1*  2.1*    Recent Labs Lab 11/28/15 1449  LIPASE 18   No results for input(s): AMMONIA in the last 168 hours. Coagulation Profile: No results for input(s): INR, PROTIME in the last 168 hours. Cardiac Enzymes: No results for input(s): CKTOTAL, CKMB, CKMBINDEX, TROPONINI in the last 168 hours. BNP (last 3 results) No results for input(s): PROBNP in the last 8760 hours. HbA1C: No results  for input(s): HGBA1C in the last 72 hours. CBG: No results for input(s): GLUCAP in the last 168 hours. Lipid Profile: No results for input(s): CHOL, HDL, LDLCALC, TRIG, CHOLHDL, LDLDIRECT in the last 72 hours. Thyroid Function Tests: No results for input(s): TSH, T4TOTAL, FREET4, T3FREE, THYROIDAB in the last 72 hours. Anemia Panel: No results for input(s): VITAMINB12, FOLATE, FERRITIN, TIBC, IRON, RETICCTPCT in the last 72 hours. Urine analysis:    Component Value Date/Time   COLORURINE AMBER* 11/28/2015 1454   APPEARANCEUR CLOUDY* 11/28/2015 1454   LABSPEC 1.020 11/28/2015 1454   PHURINE 5.5 11/28/2015 1454   GLUCOSEU 100* 11/28/2015 1454   HGBUR MODERATE* 11/28/2015 1454   BILIRUBINUR LARGE* 11/28/2015 1454   KETONESUR 15* 11/28/2015 1454   PROTEINUR >300* 11/28/2015 1454   NITRITE NEGATIVE 11/28/2015 1454   LEUKOCYTESUR SMALL* 11/28/2015 1454   Sepsis  Labs: (procalcitonin:4,lacticidven:4) )No results found for this or any previous visit (from the past 240 hour(s)).   Radiological Exams on Admission: Ct Abdomen Pelvis W Contrast  11/28/2015  CLINICAL DATA:  Nausea and lower back pain for 1 month. Elevated liver enzymes. EXAM: CT ABDOMEN AND PELVIS WITH CONTRAST TECHNIQUE: Multidetector CT imaging of the abdomen and pelvis was performed using the standard protocol following bolus administration of intravenous contrast. CONTRAST:  ISOVUE-300 IOPAMIDOL (ISOVUE-300) INJECTION 61% COMPARISON:  06/18/2014 FINDINGS: Lower chest and abdominal wall:  No contributory findings. Hepatobiliary: Cirrhotic liver morphology with surface nodularity, caudate enlargement, hepatic artery hypertrophy, and broad fissures. No evidence of mass lesion. Superimposed low-density is presumably steatosis. Contrast timing limits assessment of the portal venous system. No convincing thrombosis. No splenomegaly or ascites.Cholecystectomy with negative common bile duct. Pancreas: Unremarkable. Spleen: Unremarkable. Adrenals/Urinary Tract: Negative adrenals. No hydronephrosis or stone. Unremarkable bladder. Stomach/Bowel:  No obstruction. No appendicitis. Reproductive:No pathologic findings. Vascular/Lymphatic: No acute vascular abnormality. Atherosclerotic calcification of the aorta and iliacs. No mass or adenopathy. Other: Trace pelvic fluid. Musculoskeletal: No acute abnormalities. Chronic left L5 pars defect. IMPRESSION: 1. Cirrhotic liver morphology.  Correlate with risk factors. 2. Cholecystectomy.  No evidence of biliary obstruction. Electronically Signed   By: Marnee Spring M.D.   On: 11/28/2015 23:13    EKG: Not performed, will obtain as appropriate.   Assessment/Plan  1. Elevated LFT's, painless jaundice  - GI consulting and much appreciated  - CT findings suggest cirrhosis  - Pt denies significant alcohol use or IVDA  - Viral hepatitis panel pending    2. Kidney disease of uncertain chronicity  - SCr 1.58 on admission, up from 1.43 in December 2016; had been <1.0 earlier in 2016  - Avoid nephrotoxins, hold lisinopril  - Gentle IVF hydration given in ED  - Repeat chem panel tomorrow   3. Normocytic anemia  - Hgb 10.9 on admission with normal MCV, had been wnl in 2016   - No sign of bleeding; will check FOBT  - Check iron studies, B12, and folate; supplement prn    4. Hypertension with hypertensive urgency  - Elevated as high as 200/120 in ED  - Managed with lisinopril at home, will hold given kidney disease as above  - Start Norvasc 10 mg daily  - Hydralazine IVP's prn   5. Type II DM  - No A1c on file  - Pt managed with insulin at home, but does not know dose  - Check CBG with meals and qHS  - Start with low-intensity SSI correctional and adjust prn    6. Acute on chronic low back pain  -  Uncertain etiology, likely MSK; no concerning features or acute CT findings  - Chronic L5 defect noted on CT - Acute component resolved in ED without any specific treatment    DVT prophylaxis: sq heparin  Code Status: Full  Family Communication: Discussed with patient  Disposition Plan: Observe on med-surg   Consults called: GI  Admission status: Observation    Briscoe Deutscherimothy S Sandrine Bloodsworth, MD [facility] Pager [phone]  If 7PM-7AM, please contact night-coverage [email or web address] Password [email or web address]  11/29/2015, 1:32 AM

## 2015-11-29 NOTE — Plan of Care (Signed)
Update on patient from his primary care office:   He was seen on 05/23/15 for physical exam. His LFTs were    AST                  [H]  79 IU/L                     7-45   ALT                  [HH] 97 IU/L                     5-40   ALK PHOS             [HH] 586 IU/L                    37-137   TOTAL BILIRUBIN           1.2 mg/dl                   0.1-1.5   DIRECT BILIRUBIN     [H]  0.8 mg/dL                   0.0-0.3  RUQ U/S was ordered that showed fatty liver and is available in EPIC for review.   Based on relatively unremarkable imaging , a referral was placed to GI . He had visit scheduled w/ Dr Earlean Shawl on 06/27/15 for which the patient was a "no show" . He also had a return visit on 2/13 with my office  which he also cancelled. He has yet to reschedule this cancelled visit with my office but does have his next regularly scheduled visit in my office on 12/05/15.   Velna Hatchet, MD 11/29/2015 10:39 AM

## 2015-11-30 DIAGNOSIS — I10 Essential (primary) hypertension: Secondary | ICD-10-CM

## 2015-11-30 DIAGNOSIS — R7989 Other specified abnormal findings of blood chemistry: Secondary | ICD-10-CM

## 2015-11-30 DIAGNOSIS — E1165 Type 2 diabetes mellitus with hyperglycemia: Secondary | ICD-10-CM | POA: Diagnosis not present

## 2015-11-30 DIAGNOSIS — N289 Disorder of kidney and ureter, unspecified: Secondary | ICD-10-CM | POA: Diagnosis not present

## 2015-11-30 DIAGNOSIS — I160 Hypertensive urgency: Secondary | ICD-10-CM | POA: Diagnosis not present

## 2015-11-30 LAB — COMPREHENSIVE METABOLIC PANEL
ALT: 83 U/L — AB (ref 17–63)
AST: 109 U/L — ABNORMAL HIGH (ref 15–41)
Albumin: 1.7 g/dL — ABNORMAL LOW (ref 3.5–5.0)
Alkaline Phosphatase: 1438 U/L — ABNORMAL HIGH (ref 38–126)
Anion gap: 8 (ref 5–15)
BILIRUBIN TOTAL: 11.2 mg/dL — AB (ref 0.3–1.2)
BUN: 18 mg/dL (ref 6–20)
CALCIUM: 7.8 mg/dL — AB (ref 8.9–10.3)
CHLORIDE: 101 mmol/L (ref 101–111)
CO2: 24 mmol/L (ref 22–32)
CREATININE: 1.38 mg/dL — AB (ref 0.61–1.24)
GFR, EST NON AFRICAN AMERICAN: 59 mL/min — AB (ref >60)
Glucose, Bld: 102 mg/dL — ABNORMAL HIGH (ref 65–99)
POTASSIUM: 3.7 mmol/L (ref 3.5–5.1)
Sodium: 133 mmol/L — ABNORMAL LOW (ref 135–145)
TOTAL PROTEIN: 5.8 g/dL — AB (ref 6.5–8.1)

## 2015-11-30 LAB — ANTINUCLEAR ANTIBODIES, IFA: ANTINUCLEAR ANTIBODIES, IFA: NEGATIVE

## 2015-11-30 LAB — HEPATITIS PANEL, ACUTE
HEP B C IGM: NEGATIVE
HEP B S AG: NEGATIVE
Hep A IgM: NEGATIVE

## 2015-11-30 LAB — GLUCOSE, CAPILLARY
GLUCOSE-CAPILLARY: 94 mg/dL (ref 65–99)
GLUCOSE-CAPILLARY: 115 mg/dL — AB (ref 65–99)
GLUCOSE-CAPILLARY: 133 mg/dL — AB (ref 65–99)
Glucose-Capillary: 113 mg/dL — ABNORMAL HIGH (ref 65–99)

## 2015-11-30 LAB — HEMOGLOBIN A1C
HEMOGLOBIN A1C: 5.2 % (ref 4.8–5.6)
MEAN PLASMA GLUCOSE: 103 mg/dL

## 2015-11-30 LAB — CBC
HEMATOCRIT: 29 % — AB (ref 39.0–52.0)
Hemoglobin: 9.9 g/dL — ABNORMAL LOW (ref 13.0–17.0)
MCH: 27.9 pg (ref 26.0–34.0)
MCHC: 34.1 g/dL (ref 30.0–36.0)
MCV: 81.7 fL (ref 78.0–100.0)
PLATELETS: 224 10*3/uL (ref 150–400)
RBC: 3.55 MIL/uL — ABNORMAL LOW (ref 4.22–5.81)
RDW: 17.3 % — AB (ref 11.5–15.5)
WBC: 9.6 10*3/uL (ref 4.0–10.5)

## 2015-11-30 LAB — ANTI-SMOOTH MUSCLE ANTIBODY, IGG: F-Actin IgG: 18 Units (ref 0–19)

## 2015-11-30 LAB — FOLATE RBC
FOLATE, HEMOLYSATE: 364.3 ng/mL
FOLATE, RBC: 1261 ng/mL (ref >498)
Hematocrit: 28.9 % — ABNORMAL LOW (ref 37.5–51.0)

## 2015-11-30 LAB — ALPHA-1-ANTITRYPSIN: A-1 Antitrypsin, Ser: 208 mg/dL — ABNORMAL HIGH (ref 90–200)

## 2015-11-30 MED ORDER — KCL IN DEXTROSE-NACL 20-5-0.45 MEQ/L-%-% IV SOLN
INTRAVENOUS | Status: DC
  Administered 2015-11-30 – 2015-12-01 (×2): via INTRAVENOUS
  Filled 2015-11-30 (×2): qty 1000

## 2015-11-30 MED ORDER — HEPARIN SODIUM (PORCINE) 5000 UNIT/ML IJ SOLN: 5000.0000 [IU] | Freq: Three times a day (TID) | INTRAMUSCULAR | Status: DC

## 2015-11-30 NOTE — Progress Notes (Signed)
Pt provided with apple juice upon request. Pt resting in bed watching baseball on television.

## 2015-11-30 NOTE — Consult Note (Signed)
Chief Complaint: Patient was seen in consultation today for random liver core biopsy Chief Complaint  Patient presents with  . Back Pain   at the request of Dr Bernette Redbird  Referring Physician(s): Dr Bernette Redbird  Supervising Physician: Simonne Come  Patient Status: Inpatient  History of Present Illness: Adam Baker is a 50 y.o. male   Pt presented to ED 7/17 for back pain Work up noted jaundice Elevated liver function tests Hx DM Hepatic steatosis 7/17 CT: IMPRESSION: 1. Cirrhotic liver morphology. Correlate with risk factors. 2. Cholecystectomy. No evidence of biliary obstruction.  Occasional N/v +wt loss Denies myalgias  Request for random liver core bx per Dr Matthias Hughs Scheduled for 7/20   Past Medical History  Diagnosis Date  . Diabetes mellitus without complication (HCC)   . Hypertension     Past Surgical History  Procedure Laterality Date  . Colonoscopy    . Cholecystectomy N/A 06/21/2014    Procedure: LAPAROSCOPIC CHOLECYSTECTOMY WITH INTRAOPERATIVE CHOLANGIOGRAM;  Surgeon: Chevis Pretty III, MD;  Location: [facility];  Service: General;  Laterality: N/A;    Allergies: Penicillins and Shrimp  Medications: Prior to Admission medications   Medication Sig Start Date End Date Taking? Authorizing Provider  aspirin EC 81 MG tablet Take 81 mg by mouth daily.   Yes Historical Provider, MD  insulin NPH Human (NOVOLIN N) 100 UNIT/ML injection Inject 18U in qAM and 20U qPM Patient taking differently: Inject 20-22 Units into the skin 2 (two) times daily before a meal. Take 20 units every morning Take 22 units every evening 06/22/14  Yes [name], MD  insulin regular (NOVOLIN R) 100 units/mL injection Inject 7U before lunch Patient taking differently: Inject 5 Units into the skin 2 (two) times daily before a meal.  06/22/14  Yes [name], MD  lisinopril (PRINIVIL,ZESTRIL) 20 MG tablet Take 20 mg by mouth daily.   Yes Historical Provider, MD  metFORMIN  (GLUCOPHAGE) 1000 MG tablet Take 1,000 mg by mouth 2 (two) times daily with a meal.   Yes Historical Provider, MD  metFORMIN (GLUCOPHAGE) 1000 MG tablet Take 1 tab bid starting with first dosage on evening of 2/10 Patient not taking: Reported on 11/29/2015 06/22/14   [name], MD     Family History  Problem Relation Age of Onset  . Diabetes Mother   . Diabetes Sister   . Diabetes Brother     Social History   Social History  . Marital Status: Single    Spouse Name: N/A  . Number of Children: N/A  . Years of Education: N/A   Social History Main Topics  . Smoking status: Light Tobacco Smoker    Types: Cigarettes  . Smokeless tobacco: Never Used  . Alcohol Use: Yes     Comment: 4-5 beers a week  . Drug Use: No  . Sexual Activity: Not Asked   Other Topics Concern  . None   Social History Narrative     Review of Systems: A 12 point ROS discussed and pertinent positives are indicated in the HPI above.  All other systems are negative.  Review of Systems  Constitutional: Positive for activity change, fatigue and unexpected weight change. Negative for fever.  Respiratory: Negative for shortness of breath.   Gastrointestinal: Positive for nausea and vomiting. Negative for abdominal pain.  Genitourinary: Negative for difficulty urinating.  Neurological: Negative for weakness.  Psychiatric/Behavioral: Negative for behavioral problems and confusion.    Vital Signs: BP 186/104 mmHg  Pulse 87  Temp(Src) 98.8 F (37.1 C) (Oral)  Resp 18  Ht 5\' 7"  (1.702 m)  Wt 191 lb 9.6 oz (86.909 kg)  BMI 30.00 kg/m2  SpO2 98%  Physical Exam  Constitutional: He is oriented to person, place, and time. He appears well-nourished.  Eyes: Scleral icterus is present.  Cardiovascular: Normal rate, regular rhythm and normal heart sounds.   Pulmonary/Chest: Effort normal and breath sounds normal.  Abdominal: Soft.  Musculoskeletal: Normal range of motion.  Neurological: He is alert and  oriented to person, place, and time.  Skin: Skin is warm and dry.  Psychiatric: He has a normal mood and affect. His behavior is normal. Judgment and thought content normal.    Mallampati Score:  MD Evaluation Airway: WNL Heart: WNL Abdomen: WNL Chest/ Lungs: WNL ASA  Classification: 2 Mallampati/Airway Score: One  Imaging: Ct Abdomen Pelvis W Contrast  11/28/2015  CLINICAL DATA:  Nausea and lower back pain for 1 month. Elevated liver enzymes. EXAM: CT ABDOMEN AND PELVIS WITH CONTRAST TECHNIQUE: Multidetector CT imaging of the abdomen and pelvis was performed using the standard protocol following bolus administration of intravenous contrast. CONTRAST:  ISOVUE-300 IOPAMIDOL (ISOVUE-300) INJECTION 61% COMPARISON:  06/18/2014 FINDINGS: Lower chest and abdominal wall:  No contributory findings. Hepatobiliary: Cirrhotic liver morphology with surface nodularity, caudate enlargement, hepatic artery hypertrophy, and broad fissures. No evidence of mass lesion. Superimposed low-density is presumably steatosis. Contrast timing limits assessment of the portal venous system. No convincing thrombosis. No splenomegaly or ascites.Cholecystectomy with negative common bile duct. Pancreas: Unremarkable. Spleen: Unremarkable. Adrenals/Urinary Tract: Negative adrenals. No hydronephrosis or stone. Unremarkable bladder. Stomach/Bowel:  No obstruction. No appendicitis. Reproductive:No pathologic findings. Vascular/Lymphatic: No acute vascular abnormality. Atherosclerotic calcification of the aorta and iliacs. No mass or adenopathy. Other: Trace pelvic fluid. Musculoskeletal: No acute abnormalities. Chronic left L5 pars defect. IMPRESSION: 1. Cirrhotic liver morphology.  Correlate with risk factors. 2. Cholecystectomy.  No evidence of biliary obstruction. Electronically Signed   By: Marnee Spring M.D.   On: 11/28/2015 23:13    Labs:  CBC:  Recent Labs  04/25/15 11/28/15 1449 11/29/15 0404 11/30/15 0340    WBC 9.8 10.5  --  9.6  HGB 12.7* 10.9*  --  9.9*  HCT 37.0* 31.7* 28.9* 29.0*  PLT 190 228  --  224    COAGS:  Recent Labs  11/29/15 1048  INR 1.51*    BMP:  Recent Labs  04/25/15 11/28/15 1449 11/29/15 0404 11/30/15 0340  NA 135 134* 135 133*  K 3.5 3.8 3.3* 3.7  CL 97* 104 102 101  CO2 28 25 25 24   GLUCOSE 268* 97 112* 102*  BUN 20 15 13 18   CALCIUM 8.6* 8.4* 8.1* 7.8*  CREATININE 1.43* 1.58* 1.33* 1.38*  GFRNONAA 57* 50* >60 59*  GFRAA >60 58* >60 >60    LIVER FUNCTION TESTS:  Recent Labs  04/25/15 11/28/15 1449 11/29/15 0404 11/30/15 0340  BILITOT 1.7* 11.4*  11.7* 10.8* 11.2*  AST 82* 135*  136* 120* 109*  ALT 89* 105*  104* 92* 83*  ALKPHOS 549* 1496*  1514* 1361* 1438*  PROT 6.8 7.0  7.1 6.0* 5.8*  ALBUMIN 3.0* 2.1*  2.1* 1.7* 1.7*    TUMOR MARKERS: No results for input(s): AFPTM, CEA, CA199, CHROMGRNA in the last 8760 hours.  Assessment and Plan:  Jaundice Back pain Wt loss Elevated LFTs Now scheduled for liver core biopsy 7/20 in Rad Risks and Benefits discussed with the patient including, but not limited to bleeding, infection,  damage to adjacent structures or low yield requiring additional tests. All of the patient's questions were answered, patient is agreeable to proceed. Consent signed and in chart.   Thank you for this interesting consult.  I greatly enjoyed meeting [employer] and look forward to participating in their care.  A copy of this report was sent to the requesting provider on this date.  Electronically Signed: Gayleen Sholtz A 11/30/2015, 2:48 PM   I spent a total of 20 Minutes    in face to face in clinical consultation, greater than 50% of which was counseling/coordinating care for liver core bx

## 2015-11-30 NOTE — Progress Notes (Signed)
[facility] PROGRESS NOTE  Adam Baker MWN:[id number] DOB: June 21, 1965 DOA: 11/28/2015 PCP: [name], MD  Assessment/Plan:  1. Elevated LFT's, painless jaundice  - GI consulting and much appreciated  - CT findings suggest cirrhosis  - Pt denies significant alcohol use or IVDA  - Viral hepatitis panel - neg, Anti trypsin neg, Anti smooth musc neg, ANA neg - Liver biopsy tomorrow  2. Kidney disease of uncertain chronicity , improving - SCr 1.58 on admission, up from 1.43 in December 2016; had been <1.0 earlier in 2016  - Avoid nephrotoxins, hold lisinopril  - Gentle IVF hydration given in ED  - Repeat chem panel tomorrow   3. Normocytic anemia  - Hgb 10.9 on admission with normal MCV, had been wnl in 2016  - No sign of bleeding; will check FOBT  - Check iron studies, B12, and folate--> Low Tibc and Low value for iron; supplementstart in AM  4. Hypertension with hypertensive urgency  - Elevated as high as 200/120 in ED  - Managed with lisinopril at home, will hold given kidney disease as above  - Start Norvasc 10 mg daily  - Hydralazine IVP's prn   5. Type II DM  - A1c nl - Pt managed with insulin at home, but does not know dose  - Check CBG with meals and qHS  - Start with low-intensity SSI correctional and adjust prn   6. 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 No pain. Denies fever. No questions.  Objective: Filed Vitals:    11/30/15 1812 11/30/15 2201  BP: 162/96 154/92  Pulse: 94 87  Temp: 99.2 F (37.3 C) 99 F (37.2 C)  Resp: 18 18    Intake/Output Summary (Last 24 hours) at 11/30/15 2256 Last data filed at 11/30/15 1100  Gross per 24 hour  Intake    480 ml  Output      0 ml  Net    480 ml   Filed Weights   11/28/15 1446 11/29/15 0128  Weight: 95.255 kg (210 lb) 86.909 kg (191 lb 9.6 oz)    Exam:  General:  No diaphoresis, anxious, no acute distress; scleral icterus Cardiovascular: Regular rate and rhythm no murmurs rubs or gallops Respiratory: Clear to auscultation bilaterally no more breathing Abdomen: Nondistended bowel sounds normal nontender palpation Musculoskeletal: Moving all extremities, no deformity, 5 out of 5 strength   Data Reviewed: Basic Metabolic Panel:  Recent Labs Lab 11/28/15 1449 11/29/15 0404 11/30/15 0340  NA 134* 135 133*  K 3.8 3.3* 3.7  CL 104 102 101  CO2 GLUCOSE 97 112* 102*  BUN CREATININE 1.58* 1.33* 1.38*  CALCIUM 8.4* 8.1* 7.8*   Liver Function Tests:  Recent Labs Lab 11/28/15 1449 11/29/15 0404 11/30/15 0340  AST 135*  136* 120* 109*  ALT 105*  104* 92* 83*  ALKPHOS 1496*  1514* 1361* 1438*  BILITOT 11.4*  11.7* 10.8* 11.2*  PROT 7.0  7.1 6.0*  5.8*  ALBUMIN 2.1*  2.1* 1.7* 1.7*    Recent Labs Lab 11/28/15 1449  LIPASE 18   No results for input(s): AMMONIA in the last 168 hours. CBC:  Recent Labs Lab 11/28/15 1449 11/29/15 0404 11/30/15 0340  WBC 10.5  --  9.6  HGB 10.9*  --  9.9*  HCT 31.7* 28.9* 29.0*  MCV 83.2  --  81.7  PLT 228  --  224   Cardiac Enzymes: No results for input(s): CKTOTAL, CKMB, CKMBINDEX, TROPONINI in the last 168 hours. BNP (last 3 results) No results for input(s): BNP in the last 8760 hours.  ProBNP (last 3 results) No results for input(s): PROBNP in the last 8760 hours.  CBG:  Recent Labs Lab 11/29/15 2108 11/30/15 0604 11/30/15 1133 11/30/15 1631  11/30/15 2158  GLUCAP 124* 94 115* 113* 133*    No results found for this or any previous visit (from the past 240 hour(s)).   Studies: Ct Abdomen Pelvis W Contrast  11/28/2015  CLINICAL DATA:  Nausea and lower back pain for 1 month. Elevated liver enzymes. EXAM: CT ABDOMEN AND PELVIS WITH CONTRAST TECHNIQUE: Multidetector CT imaging of the abdomen and pelvis was performed using the standard protocol following bolus administration of intravenous contrast. CONTRAST:  100mL ISOVUE-300 IOPAMIDOL (ISOVUE-300) INJECTION 61% COMPARISON:  06/18/2014 FINDINGS: Lower chest and abdominal wall:  No contributory findings. Hepatobiliary: Cirrhotic liver morphology with surface nodularity, caudate enlargement, hepatic artery hypertrophy, and broad fissures. No evidence of mass lesion. Superimposed low-density is presumably steatosis. Contrast timing limits assessment of the portal venous system. No convincing thrombosis. No splenomegaly or ascites.Cholecystectomy with negative common bile duct. Pancreas: Unremarkable. Spleen: Unremarkable. Adrenals/Urinary Tract: Negative adrenals. No hydronephrosis or stone. Unremarkable bladder. Stomach/Bowel:  No obstruction. No appendicitis. Reproductive:No pathologic findings. Vascular/Lymphatic: No acute vascular abnormality. Atherosclerotic calcification of the aorta and iliacs. No mass or adenopathy. Other: Trace pelvic fluid. Musculoskeletal: No acute abnormalities. Chronic left L5 pars defect. IMPRESSION: 1. Cirrhotic liver morphology.  Correlate with risk factors. 2. Cholecystectomy.  No evidence of biliary obstruction. Electronically Signed   By: Marnee SpringJonathon  Watts M.D.   On: 11/28/2015 23:13    Scheduled Meds: . amLODipine  10 mg Oral Daily  . aspirin EC  81 mg Oral Daily  . [START ON 12/02/2015] heparin  5,000 Units Subcutaneous Q8H  . insulin aspart  0-9 Units Subcutaneous TID WC  . sodium chloride flush  3 mL Intravenous Q12H   Continuous Infusions:   Principal  Problem:   Elevated liver function tests Active Problems:   DM (diabetes mellitus), type 2, uncontrolled (HCC)   Essential hypertension   Hyperlipidemia   Kidney disease   Normocytic anemia   Cirrhosis (HCC)   Elevated LFTs   Chronic low back pain   Jaundice   Insulin dependent diabetes mellitus (HCC)   Hypertensive urgency   Malnutrition of moderate degree    Time spent: 30    Haydee SalterPhillip M Gavan Nordby  [facility] Pager [phone]. If 7PM-7AM, please contact night-coverage at [email or web address], password [email or web address] 11/30/2015, 10:56 PM

## 2015-11-30 NOTE — Progress Notes (Signed)
Alpha-1-antitrypsin neg.  Iron sat nl.  ANA negative, essentially excluding autoimmune hepatitis.  Hep A, B, and C all neg.  The only pending test is AMA for PBC.  Plan:  Will request IR to see pt for bx  Adam Baker, M.D. Pager [phone] If no answer or after 5 PM call [phone]

## 2015-12-01 ENCOUNTER — Observation Stay (HOSPITAL_COMMUNITY): Payer: BLUE CROSS/BLUE SHIELD

## 2015-12-01 DIAGNOSIS — E1165 Type 2 diabetes mellitus with hyperglycemia: Secondary | ICD-10-CM | POA: Diagnosis not present

## 2015-12-01 DIAGNOSIS — I10 Essential (primary) hypertension: Secondary | ICD-10-CM | POA: Diagnosis not present

## 2015-12-01 DIAGNOSIS — N289 Disorder of kidney and ureter, unspecified: Secondary | ICD-10-CM

## 2015-12-01 LAB — COMPREHENSIVE METABOLIC PANEL
ALT: 76 U/L — AB (ref 17–63)
AST: 99 U/L — AB (ref 15–41)
Albumin: 1.7 g/dL — ABNORMAL LOW (ref 3.5–5.0)
Alkaline Phosphatase: 1281 U/L — ABNORMAL HIGH (ref 38–126)
Anion gap: 5 (ref 5–15)
BILIRUBIN TOTAL: 11.4 mg/dL — AB (ref 0.3–1.2)
BUN: 19 mg/dL (ref 6–20)
CHLORIDE: 103 mmol/L (ref 101–111)
CO2: 24 mmol/L (ref 22–32)
CREATININE: 1.35 mg/dL — AB (ref 0.61–1.24)
Calcium: 8 mg/dL — ABNORMAL LOW (ref 8.9–10.3)
Glucose, Bld: 122 mg/dL — ABNORMAL HIGH (ref 65–99)
Potassium: 3.8 mmol/L (ref 3.5–5.1)
Sodium: 132 mmol/L — ABNORMAL LOW (ref 135–145)
TOTAL PROTEIN: 6.1 g/dL — AB (ref 6.5–8.1)

## 2015-12-01 LAB — CBC WITH DIFFERENTIAL/PLATELET
BASOS ABS: 0 10*3/uL (ref 0.0–0.1)
BASOS PCT: 0 %
EOS ABS: 0.3 10*3/uL (ref 0.0–0.7)
Eosinophils Relative: 3 %
HCT: 29.3 % — ABNORMAL LOW (ref 39.0–52.0)
HEMOGLOBIN: 10 g/dL — AB (ref 13.0–17.0)
LYMPHS ABS: 2.1 10*3/uL (ref 0.7–4.0)
LYMPHS PCT: 23 %
MCH: 27.9 pg (ref 26.0–34.0)
MCHC: 34.1 g/dL (ref 30.0–36.0)
MCV: 81.8 fL (ref 78.0–100.0)
Monocytes Absolute: 0.8 10*3/uL (ref 0.1–1.0)
Monocytes Relative: 9 %
NEUTROS ABS: 5.9 10*3/uL (ref 1.7–7.7)
Neutrophils Relative %: 65 %
PLATELETS: 210 10*3/uL (ref 150–400)
RBC: 3.58 MIL/uL — ABNORMAL LOW (ref 4.22–5.81)
RDW: 17.5 % — ABNORMAL HIGH (ref 11.5–15.5)
WBC: 9.1 10*3/uL (ref 4.0–10.5)

## 2015-12-01 LAB — GLUCOSE, CAPILLARY
GLUCOSE-CAPILLARY: 109 mg/dL — AB (ref 65–99)
GLUCOSE-CAPILLARY: 110 mg/dL — AB (ref 65–99)
GLUCOSE-CAPILLARY: 133 mg/dL — AB (ref 65–99)
GLUCOSE-CAPILLARY: 131 mg/dL — AB (ref 65–99)

## 2015-12-01 LAB — PROTIME-INR
INR: 1.43 (ref 0.00–1.49)
PROTHROMBIN TIME: 17.5 s — AB (ref 11.6–15.2)

## 2015-12-01 LAB — MAGNESIUM: MAGNESIUM: 1.3 mg/dL — AB (ref 1.7–2.4)

## 2015-12-01 MED ORDER — FENTANYL CITRATE (PF) 100 MCG/2ML IJ SOLN
INTRAMUSCULAR | Status: AC
  Filled 2015-12-01: qty 2

## 2015-12-01 MED ORDER — GELATIN ABSORBABLE 12-7 MM EX MISC
CUTANEOUS | Status: AC
  Filled 2015-12-01: qty 1

## 2015-12-01 MED ORDER — LIDOCAINE HCL 1 % IJ SOLN
INTRAMUSCULAR | Status: DC | PRN
  Administered 2015-12-01: 10 mL

## 2015-12-01 MED ORDER — FENTANYL CITRATE (PF) 100 MCG/2ML IJ SOLN
INTRAMUSCULAR | Status: AC | PRN
  Administered 2015-12-01 (×2): 25 ug via INTRAVENOUS

## 2015-12-01 MED ORDER — HYDRALAZINE HCL 20 MG/ML IJ SOLN
INTRAMUSCULAR | Status: AC
  Filled 2015-12-01: qty 1

## 2015-12-01 MED ORDER — MAGNESIUM SULFATE 2 GM/50ML IV SOLN
2.0000 g | Freq: Once | INTRAVENOUS | Status: AC
  Administered 2015-12-01: 2 g via INTRAVENOUS
  Filled 2015-12-01: qty 50

## 2015-12-01 MED ORDER — HYDRALAZINE HCL 20 MG/ML IJ SOLN
INTRAMUSCULAR | Status: AC | PRN
  Administered 2015-12-01: 10 mg via INTRAVENOUS

## 2015-12-01 MED ORDER — MIDAZOLAM HCL 2 MG/2ML IJ SOLN
INTRAMUSCULAR | Status: AC
  Filled 2015-12-01: qty 2

## 2015-12-01 MED ORDER — MIDAZOLAM HCL 2 MG/2ML IJ SOLN
INTRAMUSCULAR | Status: AC | PRN
  Administered 2015-12-01: 2 mg via INTRAVENOUS

## 2015-12-01 MED ORDER — LIDOCAINE HCL 1 % IJ SOLN
INTRAMUSCULAR | Status: AC
  Filled 2015-12-01: qty 20

## 2015-12-01 NOTE — Progress Notes (Signed)
[facility] PROGRESS NOTE  Alvester Eads ZOX:[id number] DOB: November 06, 1965 DOA: 11/28/2015 PCP: [name], MD  Brief 50 year old male with no history of liver issues is admitted to the hospital after he came in for evaluation of worsening of back pain. It was found he had elevated liver enzymes and bilirubin. Workup spearheaded by gastroenterology has so far come back negative. Liver biopsy has been taken with post results come back tomorrow. Plan is discharge patient tomorrow.  Assessment/Plan:  1. Elevated LFT's, painless jaundice  - GI consulting and much appreciated  - CT findings suggest cirrhosis  - Pt denies significant alcohol use or IVDA  - Viral hepatitis panel - neg, Anti trypsin neg, Anti smooth musc neg, ANA neg - Liver biopsy tomorrow  2. Kidney disease of uncertain chronicity , improving - SCr 1.58 on admission, up from 1.43 in December 2016; had been <1.0 earlier in 2016  - Creatinine has stabilized around 1.3 - Avoid nephrotoxins, hold lisinopril  - Was on IV fluids while nothing by mouth - Repeat chem panel tomorrow   3. Normocytic anemia  - Hgb 10.9 on admission with normal MCV, had been wnl in 2016  - No sign of bleeding; will check FOBT ->standing - Check iron studies, B12, and folate--> Low Tibc and Low value for iron; supplementstart in AM  4. Hypertension with hypertensive urgency  - Elevated as high as 200/120 in ED  - Managed with lisinopril at home, will hold given kidney disease as above  - Cont Norvasc 10 mg daily  - Hydralazine IVP's prn   5. Type II DM  - A1c nl - Pt managed with insulin at home, but does not know dose  - Check CBG with meals and qHS  - Cont with low-intensity SSI correctional and adjust prn   6. 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 Simonne Come  Antibiotics:  none (indicate start date, and stop date if known)  HPI/Subjective: Pt well. No pain. Denies fever. No questions at this time.  Objective: Filed Vitals:   12/01/15 1215 12/01/15 1300  BP: 148/87 148/81  Pulse: 90 88  Temp: 98.5 F (36.9 C) 98.7 F (37.1 C)  Resp: 16 15    Intake/Output Summary (Last 24 hours) at 12/01/15 1317 Last data filed at 12/01/15 0700  Gross per 24 hour  Intake    740 ml  Output      0 ml  Net    740 ml   Filed Weights   11/28/15 1446 11/29/15 0128  Weight: 95.255 kg (210 lb) 86.909 kg (191 lb 9.6 oz)    Exam:  General:  No diaphoresis, anxious, nad; scleral icterus Cardiovascular: Regular rate and rhythm no murmurs rubs or gallops Respiratory: Clear to auscultation bilaterally no more breathing Abdomen: Nondistended bowel sounds normal nontender palpation Musculoskeletal: Moving all extremities, no deformity, 5 out of 5 strength   Data Reviewed: Basic Metabolic Panel:  Recent Labs Lab 11/28/15 1449 11/29/15 0404 11/30/15 0340 12/01/15 0538  NA 134* 135 133* 132*  K 3.8 3.3* 3.7 3.8  CL 104 102 101 103  CO2 GLUCOSE 97 112* 102* 122*  BUN CREATININE 1.58* 1.33* 1.38* 1.35*  CALCIUM 8.4* 8.1* 7.8* 8.0*  MG  --   --   --  1.3*   Liver Function Tests:  Recent Labs Lab 11/28/15 1449 11/29/15 0404 11/30/15 0340 12/01/15 0538  AST 135*  136* 120* 109* 99*  ALT 105*  104* 92* 83* 76*  ALKPHOS 1496*  1514* 1361* 1438* 1281*  BILITOT 11.4*  11.7* 10.8* 11.2* 11.4*  PROT 7.0  7.1 6.0* 5.8* 6.1*  ALBUMIN 2.1*  2.1* 1.7* 1.7* 1.7*    Recent Labs Lab 11/28/15 1449  LIPASE 18   No results for  input(s): AMMONIA in the last 168 hours. CBC:  Recent Labs Lab 11/28/15 1449 11/29/15 0404 11/30/15 0340 12/01/15 0538  WBC 10.5  --  9.6 9.1  NEUTROABS  --   --   --  5.9  HGB 10.9*  --  9.9* 10.0*  HCT 31.7* 28.9* 29.0* 29.3*  MCV 83.2  --  81.7 81.8  PLT 228  --  224 210   Cardiac Enzymes: No results for input(s): CKTOTAL, CKMB, CKMBINDEX, TROPONINI in the last 168 hours. BNP (last 3 results) No results for input(s): BNP in the last 8760 hours.  ProBNP (last 3 results) No results for input(s): PROBNP in the last 8760 hours.  CBG:  Recent Labs Lab 11/30/15 1133 11/30/15 1631 11/30/15 2158 12/01/15 0635 12/01/15 1121  GLUCAP 115* 113* 133* 109* 110*    No results found for this or any previous visit (from the past 240 hour(s)).   Studies: Ir US Guide Bx Asp/drain  12/01/2015  INDICATION: Elevated LFTs of uncertain etiology. Please perform ultrasound-guided liver biopsy for tissue diagnostic purposes. EXAM: ULTRASOUND GUIDED LIVER BIOPSY COMPARISON:  CT the abdomen pelvis - 11/28/2015; right upper quadrant abdominal ultrasound - 05/30/2015 MEDICATIONS: Hydralazine 10 mg IV ANESTHESIA/SEDATION: Fentanyl 2 mcg IV; Versed 50 mg IV Total Moderate Sedation time: 15 minutes; The patient was continuously monitored during the procedure by the interventional radiology nurse under my direct supervision. COMPLICATIONS: None immediate. PROCEDURE: Informed written consent was obtained from the patient after a discussion of the risks, benefits and alternatives to treatment. The patient understands and consents the procedure. A timeout was performed prior to the initiation of the procedure. Ultrasound scanning was performed of the right upper abdominal quadrant and the procedure was planned. The right upper abdomen was prepped and draped in the usual sterile fashion. The overlying soft tissues were anesthetized with 1% lidocaine with epinephrine. A 17 gauge, 6.8 cm co-axial needle was  advanced into a peripheral aspect of the right lobe of the liver and 3 core biopsies were obtained with an 18 gauge core device under direct ultrasound guidance. The co-axial needle track was embolized with the administration of a Gel-Foam slurry. Superficial hemostasis was obtained with manual compression. Post procedural scanning was negative for definitive area of hemorrhage. A dressing was placed. The patient tolerated the procedure well without immediate post procedural complication. IMPRESSION: Technically successful ultrasound guided liver biopsy. Electronically Signed   By: Simonne Come M.D.   On: 12/01/2015 11:33    Scheduled Meds: . amLODipine  10 mg Oral Daily  . fentaNYL      . gelatin adsorbable      . [START ON 12/02/2015] heparin  5,000 Units Subcutaneous Q8H  . hydrALAZINE      . insulin aspart  0-9 Units Subcutaneous TID WC  . lidocaine      . midazolam      . sodium chloride flush  3 mL Intravenous Q12H   Continuous Infusions:   Principal Problem:   Elevated liver function tests Active Problems:   DM (diabetes mellitus), type 2, uncontrolled (HCC)   Essential hypertension   Hyperlipidemia   Kidney disease   Normocytic anemia   Cirrhosis (HCC)   Elevated LFTs   Chronic low back pain   Jaundice   Insulin dependent diabetes mellitus (HCC)   Hypertensive urgency   Malnutrition of moderate degree    Time spent: 30    Haydee SalterPhillip M Hobbs  [facility] Pager [phone]. If 7PM-7AM, please contact night-coverage at [email or web address], password [email or web address] 12/01/2015, 1:17 PM

## 2015-12-01 NOTE — Progress Notes (Signed)
Pt back from IR 

## 2015-12-01 NOTE — Sedation Documentation (Signed)
Patient denies pain and is resting comfortably.  

## 2015-12-01 NOTE — Progress Notes (Signed)
Pt comfortable post liver bx--hopefully results will be ready tomorrow.  Anti-smooth muscle Ab came back neg, going against autoimmune hepatitis.  RECOMM:  1.  I have d/c'd pt's ASA for now, b/o bx--it can be restarted in 2 wks  2.  Would observe pt overnight;  if stable, ok for dischg tomorrow (hopefully, liver bx will be ready before he leaves so I can discuss it with him).  3.  [facility] to contact pt to make appt in their clinic for f/u of chron liver disease.  I do not anticipate pt having to f/u w/ me in the office, but have asked him to call me if he hasn't heard from [facility] in 1 wk  Florencia Reasonsobert V. Breanne Olvera, M.D. Pager [phone] If no answer or after 5 PM call [phone]

## 2015-12-01 NOTE — Procedures (Signed)
Technically successful US guided biopsy of right lobe of the liver.   EBL: Minimal   No immediate complications.   Jay Jeanene Mena, MD Pager #: [phone]   

## 2015-12-01 NOTE — Progress Notes (Addendum)
Pt to IR. Alert and oriented x4, denies pain. Reports some nausea. Bowel sounds hypoactive. Reports he is not passing gas this morning.

## 2015-12-02 DIAGNOSIS — R7989 Other specified abnormal findings of blood chemistry: Secondary | ICD-10-CM | POA: Diagnosis not present

## 2015-12-02 DIAGNOSIS — E1165 Type 2 diabetes mellitus with hyperglycemia: Secondary | ICD-10-CM | POA: Diagnosis not present

## 2015-12-02 DIAGNOSIS — I160 Hypertensive urgency: Secondary | ICD-10-CM | POA: Diagnosis not present

## 2015-12-02 LAB — CBC WITH DIFFERENTIAL/PLATELET
BASOS PCT: 0 %
Basophils Absolute: 0 10*3/uL (ref 0.0–0.1)
EOS ABS: 0.2 10*3/uL (ref 0.0–0.7)
EOS PCT: 2 %
HEMATOCRIT: 28.2 % — AB (ref 39.0–52.0)
HEMOGLOBIN: 9.7 g/dL — AB (ref 13.0–17.0)
LYMPHS ABS: 2 10*3/uL (ref 0.7–4.0)
Lymphocytes Relative: 19 %
MCH: 28.4 pg (ref 26.0–34.0)
MCHC: 34.4 g/dL (ref 30.0–36.0)
MCV: 82.5 fL (ref 78.0–100.0)
MONO ABS: 0.9 10*3/uL (ref 0.1–1.0)
Monocytes Relative: 9 %
Neutro Abs: 7.4 10*3/uL (ref 1.7–7.7)
Neutrophils Relative %: 70 %
Platelets: 259 10*3/uL (ref 150–400)
RBC: 3.42 MIL/uL — ABNORMAL LOW (ref 4.22–5.81)
RDW: 18 % — ABNORMAL HIGH (ref 11.5–15.5)
WBC: 10.5 10*3/uL (ref 4.0–10.5)

## 2015-12-02 LAB — COMPREHENSIVE METABOLIC PANEL
ALBUMIN: 1.6 g/dL — AB (ref 3.5–5.0)
ALT: 77 U/L — ABNORMAL HIGH (ref 17–63)
AST: 107 U/L — AB (ref 15–41)
Alkaline Phosphatase: 1303 U/L — ABNORMAL HIGH (ref 38–126)
Anion gap: 6 (ref 5–15)
BUN: 23 mg/dL — AB (ref 6–20)
CO2: 24 mmol/L (ref 22–32)
Calcium: 8.1 mg/dL — ABNORMAL LOW (ref 8.9–10.3)
Chloride: 103 mmol/L (ref 101–111)
Creatinine, Ser: 1.55 mg/dL — ABNORMAL HIGH (ref 0.61–1.24)
GFR calc Af Amer: 59 mL/min — ABNORMAL LOW (ref >60)
GFR, EST NON AFRICAN AMERICAN: 51 mL/min — AB (ref >60)
GLUCOSE: 112 mg/dL — AB (ref 65–99)
POTASSIUM: 4 mmol/L (ref 3.5–5.1)
Sodium: 133 mmol/L — ABNORMAL LOW (ref 135–145)
Total Bilirubin: 11.5 mg/dL — ABNORMAL HIGH (ref 0.3–1.2)
Total Protein: 5.9 g/dL — ABNORMAL LOW (ref 6.5–8.1)

## 2015-12-02 LAB — GLUCOSE, CAPILLARY
GLUCOSE-CAPILLARY: 101 mg/dL — AB (ref 65–99)
Glucose-Capillary: 112 mg/dL — ABNORMAL HIGH (ref 65–99)

## 2015-12-02 LAB — MAGNESIUM: MAGNESIUM: 1.6 mg/dL — AB (ref 1.7–2.4)

## 2015-12-02 LAB — PROTIME-INR
INR: 1.44 (ref 0.00–1.49)
PROTHROMBIN TIME: 17.6 s — AB (ref 11.6–15.2)

## 2015-12-02 LAB — PHOSPHORUS: Phosphorus: 4 mg/dL (ref 2.5–4.6)

## 2015-12-02 MED ORDER — INSULIN REGULAR HUMAN 100 UNIT/ML IJ SOLN: 5.0000 [IU] | Freq: Two times a day (BID) | INTRAMUSCULAR | Status: AC

## 2015-12-02 MED ORDER — AMLODIPINE BESYLATE 10 MG PO TABS: 10.0000 mg | ORAL_TABLET | Freq: Every day | ORAL | Status: AC

## 2015-12-02 MED ORDER — INSULIN NPH (HUMAN) (ISOPHANE) 100 UNIT/ML ~~LOC~~ SUSP: 20.0000 [IU] | Freq: Two times a day (BID) | SUBCUTANEOUS | Status: AC

## 2015-12-02 MED ORDER — POLYETHYLENE GLYCOL 3350 17 G PO PACK: 17.0000 g | PACK | Freq: Every day | ORAL | Status: AC | PRN

## 2015-12-02 NOTE — Discharge Summary (Signed)
Physician Discharge Summary  Adam Baker ZOX:[id number] DOB: 03/09/1966 DOA: 11/28/2015  PCP: Alysia PennaHOLWERDA, SCOTT, MD  Admit date: 11/28/2015 Discharge date: 12/02/2015  Time spent: 45 minutes  Recommendations for Outpatient Follow-up:  1. [facility]rho referral by PCP 2. [facility] 3. PCP f/u   Discharge Diagnoses:  Principal Problem:   Elevated liver function tests Active Problems:   DM (diabetes mellitus), type 2, uncontrolled (HCC)   Essential hypertension   Hyperlipidemia   Kidney disease   Normocytic anemia   Cirrhosis (HCC)   Elevated LFTs   Chronic low back pain   Jaundice   Insulin dependent diabetes mellitus (HCC)   Hypertensive urgency   Malnutrition of moderate degree   Discharge Condition: stable  Diet recommendation: low salt, renal, DM  Filed Weights   11/28/15 1446 11/29/15 0128  Weight: 95.255 kg (210 lb) 86.909 kg (191 lb 9.6 oz)    History of present illness:  Adam Lovelyllen Tata is a 50 y.o. male with medical history significant for hypertension and insulin-dependent diabetes mellitus who presents the emergency department for evaluation of acute on chronic low back pain. Patient endorses a long-standing history of chronic low back pain, but notes insidious worsening over the past 2 weeks. Pain is localized to just left of midline in the lumbar region, worse with any activity or movement, and better with rest. He denies any recent weight loss, night sweats, fevers, chills, or dysuria. He does, however, endorse recent darkening of his urine and yellowing of his eyes. He had been evaluated by his PCP approximately one month ago for these complaints and was noted to have elevation in his LFTs. Abdominal ultrasound was obtained at that time and findings were consistent with hepatic steatosis. Patient was reportedly referred to a specialist, but states that the appointment had never been made. Patient denies any family history of hepatitis or liver disease. He  denies any history of heavy alcohol use or any use of intravenous drugs. He denies any abdominal pain, but endorses some intermittent and mild nausea. He denies vomiting or diarrhea.  Hospital Course:  Elevated LFT -Alpha-1-antitrypsin neg. Iron sat nl ANA negative, essentially excluding autoimmune hepatitis. Hep A, B, and C all neg. Anti sm musc antibody neg  AKI/CKD Due to HTN urgency? Stopped ACEI F/u with neprho as op, pt requesting d/c  Anemia Low iron labs, needed suppl   Procedures:  IR liver biopsy (i.e. Studies not automatically included, echos, thoracentesis, etc; not x-rays)  Consultations:  Radiology, GI  Discharge Exam: Filed Vitals:   12/02/15 0210 12/02/15 0626  BP: 136/82 148/87  Pulse: 87 85  Temp: 100 F (37.8 C) 99.2 F (37.3 C)  Resp: 16 17     General:  No diaphoresis, anxious, no acute distress  Cardiovascular: Regular rate and rhythm no murmurs rubs or gallops  Respiratory: Clear to auscultation bilaterally no more breathing  Abdomen: Nondistended bowel sounds normal nontender palpation  Musculoskeletal: Moving all extremities, no deformity, 5 out of 5 strength   Skin: jaundice   Discharge Instructions    Current Discharge Medication List    CONTINUE these medications which have NOT CHANGED   Details  aspirin EC 81 MG tablet Take 81 mg by mouth daily.    insulin NPH Human (NOVOLIN N) 100 UNIT/ML injection Inject 18U in qAM and 20U qPM Qty: 10 mL, Refills: 11    insulin regular (NOVOLIN R) 100 units/mL injection Inject 7U before lunch Qty: 10 mL, Refills: 11  lisinopril (PRINIVIL,ZESTRIL) 20 MG tablet Take 20 mg by mouth daily.    !! metFORMIN (GLUCOPHAGE) 1000 MG tablet Take 1,000 mg by mouth 2 (two) times daily with a meal.    !! metFORMIN (GLUCOPHAGE) 1000 MG tablet Take 1 tab bid starting with first dosage on evening of 2/10     !! - Potential duplicate medications found. Please discuss with provider.      Allergies  Allergen Reactions  . Penicillins Nausea Only  . Shrimp [Shellfish Allergy] Nausea And Vomiting      The results of significant diagnostics from this hospitalization (including imaging, microbiology, ancillary and laboratory) are listed below for reference.    Significant Diagnostic Studies: Ct Abdomen Pelvis W Contrast  11/28/2015  CLINICAL DATA:  Nausea and lower back pain for 1 month. Elevated liver enzymes. EXAM: CT ABDOMEN AND PELVIS WITH CONTRAST TECHNIQUE: Multidetector CT imaging of the abdomen and pelvis was performed using the standard protocol following bolus administration of intravenous contrast. CONTRAST:  ISOVUE-300 IOPAMIDOL (ISOVUE-300) INJECTION 61% COMPARISON:  06/18/2014 FINDINGS: Lower chest and abdominal wall:  No contributory findings. Hepatobiliary: Cirrhotic liver morphology with surface nodularity, caudate enlargement, hepatic artery hypertrophy, and broad fissures. No evidence of mass lesion. Superimposed low-density is presumably steatosis. Contrast timing limits assessment of the portal venous system. No convincing thrombosis. No splenomegaly or ascites.Cholecystectomy with negative common bile duct. Pancreas: Unremarkable. Spleen: Unremarkable. Adrenals/Urinary Tract: Negative adrenals. No hydronephrosis or stone. Unremarkable bladder. Stomach/Bowel:  No obstruction. No appendicitis. Reproductive:No pathologic findings. Vascular/Lymphatic: No acute vascular abnormality. Atherosclerotic calcification of the aorta and iliacs. No mass or adenopathy. Other: Trace pelvic fluid. Musculoskeletal: No acute abnormalities. Chronic left L5 pars defect. IMPRESSION: 1. Cirrhotic liver morphology.  Correlate with risk factors. 2. Cholecystectomy.  No evidence of biliary obstruction. Electronically Signed   By: Marnee Spring M.D.   On: 11/28/2015 23:13   Ir US Guide Bx Asp/drain  12/01/2015  INDICATION: Elevated LFTs of uncertain etiology. Please perform  ultrasound-guided liver biopsy for tissue diagnostic purposes. EXAM: ULTRASOUND GUIDED LIVER BIOPSY COMPARISON:  CT the abdomen pelvis - 11/28/2015; right upper quadrant abdominal ultrasound - 05/30/2015 MEDICATIONS: Hydralazine 10 mg IV ANESTHESIA/SEDATION: Fentanyl 2 mcg IV; Versed 50 mg IV Total Moderate Sedation time: 15 minutes; The patient was continuously monitored during the procedure by the interventional radiology nurse under my direct supervision. COMPLICATIONS: None immediate. PROCEDURE: Informed written consent was obtained from the patient after a discussion of the risks, benefits and alternatives to treatment. The patient understands and consents the procedure. A timeout was performed prior to the initiation of the procedure. Ultrasound scanning was performed of the right upper abdominal quadrant and the procedure was planned. The right upper abdomen was prepped and draped in the usual sterile fashion. The overlying soft tissues were anesthetized with 1% lidocaine with epinephrine. A 17 gauge, 6.8 cm co-axial needle was advanced into a peripheral aspect of the right lobe of the liver and 3 core biopsies were obtained with an 18 gauge core device under direct ultrasound guidance. The co-axial needle track was embolized with the administration of a Gel-Foam slurry. Superficial hemostasis was obtained with manual compression. Post procedural scanning was negative for definitive area of hemorrhage. A dressing was placed. The patient tolerated the procedure well without immediate post procedural complication. IMPRESSION: Technically successful ultrasound guided liver biopsy. Electronically Signed   By: Simonne Come M.D.   On: 12/01/2015 11:33    Microbiology: No results found for this or any previous visit (from the  past 240 hour(s)).   Labs: Basic Metabolic Panel:  Recent Labs Lab 11/28/15 1449 11/29/15 0404 11/30/15 0340 12/01/15 0538 12/02/15 0623  NA 134* 135 133* 132* 133*  K 3.8 3.3*  3.7 3.8 4.0  CL 104 102 101 103 103  CO2 GLUCOSE 97 112* 102* 122* 112*  BUN 23*  CREATININE 1.58* 1.33* 1.38* 1.35* 1.55*  CALCIUM 8.4* 8.1* 7.8* 8.0* 8.1*  MG  --   --   --  1.3* 1.6*  PHOS  --   --   --   --  4.0   Liver Function Tests:  Recent Labs Lab 11/28/15 1449 11/29/15 0404 11/30/15 0340 12/01/15 0538 12/02/15 0623  AST 135*  136* 120* 109* 99* 107*  ALT 105*  104* 92* 83* 76* 77*  ALKPHOS 1496*  1514* 1361* 1438* 1281* 1303*  BILITOT 11.4*  11.7* 10.8* 11.2* 11.4* 11.5*  PROT 7.0  7.1 6.0* 5.8* 6.1* 5.9*  ALBUMIN 2.1*  2.1* 1.7* 1.7* 1.7* 1.6*    Recent Labs Lab 11/28/15 1449  LIPASE 18   No results for input(s): AMMONIA in the last 168 hours. CBC:  Recent Labs Lab 11/28/15 1449 11/29/15 0404 11/30/15 0340 12/01/15 0538 12/02/15 0623  WBC 10.5  --  9.6 9.1 10.5  NEUTROABS  --   --   --  5.9 7.4  HGB 10.9*  --  9.9* 10.0* 9.7*  HCT 31.7* 28.9* 29.0* 29.3* 28.2*  MCV 83.2  --  81.7 81.8 82.5  PLT 228  --  224 210 259   Cardiac Enzymes: No results for input(s): CKTOTAL, CKMB, CKMBINDEX, TROPONINI in the last 168 hours. BNP: BNP (last 3 results) No results for input(s): BNP in the last 8760 hours.  ProBNP (last 3 results) No results for input(s): PROBNP in the last 8760 hours.  CBG:  Recent Labs Lab 12/01/15 0635 12/01/15 1121 12/01/15 1639 12/01/15 2230 12/02/15 0626  GLUCAP 109* 110* 133* 131* 101*       Signed:  Haydee Salter MD  FACP  [facility] 12/02/2015, 9:15 AM

## 2015-12-02 NOTE — Discharge Instructions (Signed)
[facility] will contact you to schedule an appointment. If you dont here from the call [facility] main number to contact therm.   Jaundice, Adult Jaundice is when the skin, the whites of the eyes, and mucous membranes turn a yellowish color. It is caused by a high level of bilirubin in the blood. Bilirubin is made in the body when red blood cells break down normally. Having jaundice may mean that your liver or your body's bile system is not working right. HOME CARE  Drink enough fluid to keep your pee (urine) clear or pale yellow.  Do not drink alcohol.  Take medicines only as told by your doctor.  Keep all follow-up visits as told by your doctor. This is important.  You can use skin lotion to help with itching. GET HELP IF:  You have a fever. GET HELP RIGHT AWAY IF:  Your symptoms suddenly get worse.  You have symptoms for more than 72 hours.  Your pain gets worse.  You keep throwing up (vomiting).  You become weak or confused.  You get a very bad headache.  You lose too much body fluid (dehydration). Signs that have you lost too much body fluid include:  A very dry mouth.  A fast, weak pulse.  Fast breathing.  Blue lips.   This information is not intended to replace advice given to you by your health care provider. Make sure you discuss any questions you have with your health care provider.   Document Released: 06/02/2010 Document Revised: 09/14/2014 Document Reviewed: 04/26/2014 Elsevier Interactive Patient Education [employer]2016 [employer]vier Inc.

## 2015-12-02 NOTE — Progress Notes (Signed)
Anti-mitochondrial antibody still pending.  Patient comfortable following yesterday's liver biopsy.  Labs today are essentially stable, including hemoglobin level postbiopsy and patient's liver chemistries.  The patient is lying in bed in no distress, alert and coherent.  Preliminary biopsy results show advanced fibrosis, possible cirrhosis, and bile duct proliferation, bile congestion, and active hepatocellular inflammation but interestingly only mild steatosis, nothing to really suggest NASH. 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 I think it is fine for the patient go home today. No special medications or activity restrictions are needed with respect to his liver disease (however, he was advised to avoid contact sports etc. for the next 2 weeks following his liver biopsy).  The patient knows to contact my office if he has not heard from the [facility] within a week.  Florencia Reasonsobert V. Marvetta Vohs, M.D. Pager [phone] If no answer or after 5 PM call [phone]

## 2015-12-02 NOTE — Progress Notes (Signed)
Discharge instructions reviewed with patient. Emphasized the important of calling to schedule his follow up appointment. Staff assisted patient down to family vehicle.

## 2015-12-04 LAB — MITOCHONDRIAL ANTIBODIES: Mitochondrial M2 Ab, IgG: 11.8 Units (ref 0.0–20.0)

## 2015-12-27 ENCOUNTER — Encounter (HOSPITAL_COMMUNITY): Payer: Self-pay

## 2016-02-29 ENCOUNTER — Other Ambulatory Visit (HOSPITAL_COMMUNITY): Payer: Self-pay | Admitting: Gastroenterology

## 2016-02-29 DIAGNOSIS — K7210 Chronic hepatic failure without coma: Secondary | ICD-10-CM

## 2016-02-29 DIAGNOSIS — K7290 Hepatic failure, unspecified without coma: Secondary | ICD-10-CM

## 2016-03-12 ENCOUNTER — Ambulatory Visit (HOSPITAL_COMMUNITY)
Admission: RE | Admit: 2016-03-12 | Discharge: 2016-03-12 | Disposition: A | Discharge status: Home / Self Care | Payer: BLUE CROSS/BLUE SHIELD | Source: Ambulatory Visit | Attending: Gastroenterology | Admitting: Gastroenterology

## 2016-03-12 DIAGNOSIS — K7469 Other cirrhosis of liver: Secondary | ICD-10-CM | POA: Insufficient documentation

## 2016-03-12 DIAGNOSIS — R188 Other ascites: Secondary | ICD-10-CM | POA: Diagnosis not present

## 2016-03-12 DIAGNOSIS — K7290 Hepatic failure, unspecified without coma: Secondary | ICD-10-CM

## 2016-03-12 DIAGNOSIS — K7210 Chronic hepatic failure without coma: Secondary | ICD-10-CM

## 2016-03-12 LAB — GRAM STAIN: GRAM STAIN: NONE SEEN

## 2016-03-12 MED ORDER — ALBUMIN HUMAN 25 % IV SOLN
62.5000 g | Freq: Once | INTRAVENOUS | Status: AC
  Administered 2016-03-12: 62.5 g via INTRAVENOUS
  Filled 2016-03-12: qty 300

## 2016-03-12 MED ORDER — LIDOCAINE HCL 1 % IJ SOLN
INTRAMUSCULAR | Status: AC
  Filled 2016-03-12: qty 20

## 2016-03-12 NOTE — Procedures (Signed)
This procedure has been fully reviewed with the patient and written informed consent has been obtained.   Successful US guided paracentesis from RLQ.  Yielded 7.5L of clear yellow fluid.  No immediate complications.  Pt tolerated well.   Specimen was sent for labs.  Brayton ElBRUNING, Ryen Heitmeyer PA-C 03/12/2016 10:31 AM

## 2016-03-17 LAB — CULTURE, BODY FLUID-BOTTLE: CULTURE: NO GROWTH

## 2016-03-17 LAB — CULTURE, BODY FLUID W GRAM STAIN -BOTTLE

## 2016-04-09 ENCOUNTER — Other Ambulatory Visit (HOSPITAL_COMMUNITY): Payer: Self-pay | Admitting: *Deleted

## 2016-04-10 ENCOUNTER — Inpatient Hospital Stay (HOSPITAL_COMMUNITY): Admission: RE | Admit: 2016-04-10 | Payer: BLUE CROSS/BLUE SHIELD | Source: Ambulatory Visit

## 2016-04-18 ENCOUNTER — Other Ambulatory Visit (HOSPITAL_COMMUNITY): Payer: Self-pay | Admitting: *Deleted

## 2016-04-19 ENCOUNTER — Encounter (HOSPITAL_COMMUNITY): Payer: BLUE CROSS/BLUE SHIELD

## 2016-04-26 ENCOUNTER — Ambulatory Visit (HOSPITAL_COMMUNITY): Admission: RE | Admit: 2016-04-26 | Payer: BLUE CROSS/BLUE SHIELD | Source: Ambulatory Visit

## 2016-11-15 MED ORDER — SODIUM POLYSTYRENE SULFONATE 15 GRAM/60 ML ORAL SUSPENSION
Freq: Once | ORAL | 0 refills | 0 days | Status: CP
Start: 2016-11-15 — End: 2016-11-15

## 2016-11-19 MED FILL — ATORVASTATIN/40MG/TABS: ATORVASTATIN/40MG/TABS | 30 days supply | Qty: 30 | Fill #2

## 2016-11-21 ENCOUNTER — Ambulatory Visit
Admission: RE | Admit: 2016-11-21 | Discharge: 2016-11-21 | Disposition: A | Discharge status: Home / Self Care | Payer: MEDICARE

## 2016-11-21 ENCOUNTER — Ambulatory Visit
Admission: RE | Admit: 2016-11-21 | Discharge: 2016-11-21 | Disposition: A | Discharge status: Home / Self Care | Payer: MEDICARE | Attending: Pharmacist Clinician (PhC)/ Clinical Pharmacy Specialist | Admitting: Pharmacist Clinician (PhC)/ Clinical Pharmacy Specialist

## 2016-11-21 ENCOUNTER — Ambulatory Visit
Admission: RE | Admit: 2016-11-21 | Discharge: 2016-11-21 | Disposition: A | Discharge status: Home / Self Care | Payer: MEDICARE | Attending: Nephrology | Admitting: Nephrology

## 2016-11-21 DIAGNOSIS — Z48298 Encounter for aftercare following other organ transplant: Secondary | ICD-10-CM

## 2016-11-21 DIAGNOSIS — Z940 Kidney transplant status: Principal | ICD-10-CM

## 2016-11-21 DIAGNOSIS — E559 Vitamin D deficiency, unspecified: Secondary | ICD-10-CM

## 2016-11-21 DIAGNOSIS — Z944 Liver transplant status: Secondary | ICD-10-CM

## 2016-11-21 DIAGNOSIS — I151 Hypertension secondary to other renal disorders: Secondary | ICD-10-CM

## 2016-11-21 DIAGNOSIS — D899 Disorder involving the immune mechanism, unspecified: Secondary | ICD-10-CM

## 2016-11-21 DIAGNOSIS — N2889 Other specified disorders of kidney and ureter: Secondary | ICD-10-CM

## 2016-11-21 DIAGNOSIS — Z0000 Encounter for general adult medical examination without abnormal findings: Secondary | ICD-10-CM

## 2016-11-21 MED ORDER — ASPIRIN 81 MG TABLET,DELAYED RELEASE
ORAL_TABLET | Freq: Every day | ORAL | 2 refills | 0.00000 days
Start: 2016-11-21 — End: 2018-06-10

## 2016-11-21 MED ORDER — GLIPIZIDE 5 MG TABLET
ORAL_TABLET | Freq: Two times a day (BID) | ORAL | 11 refills | 0 days | Status: CP
Start: 2016-11-21 — End: 2017-04-26

## 2016-11-21 MED ORDER — MAGNESIUM OXIDE 400 MG (241.3 MG MAGNESIUM) TABLET
ORAL_TABLET | Freq: Every day | ORAL | 11 refills | 0 days
Start: 2016-11-21 — End: 2017-01-09

## 2016-11-21 MED ORDER — ATORVASTATIN 40 MG TABLET
ORAL_TABLET | Freq: Every day | ORAL | 3 refills | 0 days | Status: CP
Start: 2016-11-21 — End: 2018-04-16

## 2016-11-21 MED FILL — GLIPIZIDE/5MG/TABS: GLIPIZIDE/5MG/TABS | 30 days supply | Qty: 60 | Fill #0

## 2016-12-06 NOTE — Unmapped (Signed)
Adding phone # [phone]  Patient also updated address: [address], updated in [facility]

## 2016-12-06 NOTE — Unmapped (Addendum)
[facility] Refill Coordination Note  Specialty Medication(s): Myfortic  Additional Medications shipped: mg ox    Hector Sanchez, DOB: [birth year]  Phone: [phone] (home) , Alternate phone contact: N/A  Phone or address changes today?: Yes* - [phone], AND [address] [place] [address]  All above HIPAA information was verified with patient.  Shipping Address:SEE [facility]  Insurance changes? No    Completed refill call assessment today to schedule patient's medication shipment from the [facility] [phone]).      Confirmed the medication and dosage are correct and have not changed: Yes, regimen is correct and unchanged.    Confirmed patient started or stopped the following medications in the past month:  No, there are no changes reported at this time.    Are you tolerating your medication?:  Hector Sanchez reports tolerating the medication.    ADHERENCE    (Below is required for Medicare Part B or Transplant patients only - per drug):   How many tablets were dispensed last month: Myfortic - 120  Patient currently has 20 remaining.    Did you miss any doses in the past 4 weeks? No missed doses reported.    FINANCIAL/SHIPPING    Delivery Scheduled: Yes, Expected medication delivery date: Monday, July 30     Hector Sanchez did not have any additional questions at this time.    Delivery address validated in [facility] scheduling system: Yes, address listed in [facility] is correct.    We will follow up with patient monthly for standard refill processing and delivery.      Thank you,  Tawanna Solo Shared [facility] Specialty Pharmacist

## 2016-12-07 LAB — TACROLIMUS BLOOD: Lab: 7.3

## 2016-12-07 MED FILL — MYFORTIC/180MG/TAB: MYFORTIC/180MG/TAB | 30 days supply | Qty: 120 | Fill #1

## 2016-12-07 MED FILL — MAGNESIUM OXIDE/400MG/TABS: MAGNESIUM OXIDE/400MG/TABS | 30 days supply | Qty: 60 | Fill #1

## 2016-12-12 LAB — CBC W/ DIFFERENTIAL
BANDED NEUTROPHILS ABSOLUTE COUNT: 0 10*3/uL (ref 0.0–0.1)
BASOPHILS ABSOLUTE COUNT: 0 10*3/uL (ref 0.0–0.2)
BASOPHILS RELATIVE PERCENT: 1 %
EOSINOPHILS ABSOLUTE COUNT: 0.2 10*3/uL (ref 0.0–0.4)
EOSINOPHILS RELATIVE PERCENT: 4 %
HEMATOCRIT: 37.9 % (ref 37.5–51.0)
HEMOGLOBIN: 12.5 g/dL — ABNORMAL LOW (ref 13.0–17.7)
IMMATURE GRANULOCYTES: 0 %
LYMPHOCYTES ABSOLUTE COUNT: 1.8 10*3/uL (ref 0.7–3.1)
MEAN CORPUSCULAR HEMOGLOBIN CONC: 33 g/dL (ref 31.5–35.7)
MEAN CORPUSCULAR HEMOGLOBIN: 29.4 pg (ref 26.6–33.0)
MEAN CORPUSCULAR VOLUME: 89 fL (ref 79–97)
MONOCYTES ABSOLUTE COUNT: 0.7 10*3/uL (ref 0.1–0.9)
MONOCYTES RELATIVE PERCENT: 12 %
NEUTROPHILS RELATIVE PERCENT: 50 %
PLATELET COUNT: 249 10*3/uL (ref 150–379)
RED BLOOD CELL COUNT: 4.25 x10E6/uL (ref 4.14–5.80)
RED CELL DISTRIBUTION WIDTH: 13.6 % (ref 12.3–15.4)
WHITE BLOOD CELL COUNT: 5.6 10*3/uL (ref 3.4–10.8)

## 2016-12-12 LAB — COMPREHENSIVE METABOLIC PANEL
A/G RATIO: 1.6 (ref 1.2–2.2)
ALBUMIN: 4.4 g/dL (ref 3.5–5.5)
ALT (SGPT): 43 IU/L (ref 0–44)
AST (SGOT): 28 IU/L (ref 0–40)
BILIRUBIN TOTAL: 0.6 mg/dL (ref 0.0–1.2)
BLOOD UREA NITROGEN: 19 mg/dL (ref 6–24)
BUN / CREAT RATIO: 14 (ref 9–20)
CALCIUM: 9.1 mg/dL (ref 8.7–10.2)
CHLORIDE: 101 mmol/L (ref 96–106)
CO2: 21 mmol/L (ref 20–29)
CREATININE: 1.33 mg/dL — ABNORMAL HIGH (ref 0.76–1.27)
GLOBULIN, TOTAL: 2.7 g/dL (ref 1.5–4.5)
GLUCOSE: 152 mg/dL — ABNORMAL HIGH (ref 65–99)
POTASSIUM: 5 mmol/L (ref 3.5–5.2)
SODIUM: 140 mmol/L (ref 134–144)

## 2016-12-12 LAB — MAGNESIUM: Lab: 1.7

## 2016-12-12 LAB — GAMMA GT: GAMMA GLUTAMYL TRANSFERASE: 22 IU/L (ref 0–65)

## 2016-12-12 LAB — GAMMA GLUTAMYL TRANSFERASE: Lab: 22

## 2016-12-12 LAB — PHOSPHORUS, SERUM: Lab: 4

## 2016-12-12 LAB — BILIRUBIN DIRECT: Lab: 0.12

## 2016-12-12 LAB — TOTAL PROTEIN: Lab: 7.1

## 2016-12-12 LAB — MONOCYTES RELATIVE PERCENT: Lab: 12

## 2016-12-13 LAB — TACROLIMUS BLOOD: Lab: 4.3

## 2016-12-14 NOTE — Unmapped (Signed)
Left vm for pt re scheduling his 9 mo appt around 9/10 since it's closer to his tx anniversary instead of at end of month as he'd wanted . Left call back number.

## 2016-12-19 LAB — CBC W/ DIFFERENTIAL
BASOPHILS ABSOLUTE COUNT: 0 10*3/uL (ref 0.0–0.2)
BASOPHILS RELATIVE PERCENT: 1 %
EOSINOPHILS ABSOLUTE COUNT: 0.2 10*3/uL (ref 0.0–0.4)
EOSINOPHILS RELATIVE PERCENT: 4 %
HEMATOCRIT: 37.8 % (ref 37.5–51.0)
HEMOGLOBIN: 12.7 g/dL — ABNORMAL LOW (ref 13.0–17.7)
IMMATURE GRANULOCYTES: 0 %
LYMPHOCYTES ABSOLUTE COUNT: 1.8 10*3/uL (ref 0.7–3.1)
LYMPHOCYTES RELATIVE PERCENT: 36 %
MEAN CORPUSCULAR HEMOGLOBIN CONC: 33.6 g/dL (ref 31.5–35.7)
MEAN CORPUSCULAR HEMOGLOBIN: 29.1 pg (ref 26.6–33.0)
MEAN CORPUSCULAR VOLUME: 87 fL (ref 79–97)
MONOCYTES ABSOLUTE COUNT: 0.6 10*3/uL (ref 0.1–0.9)
MONOCYTES RELATIVE PERCENT: 13 %
NEUTROPHILS ABSOLUTE COUNT: 2.4 10*3/uL (ref 1.4–7.0)
NEUTROPHILS RELATIVE PERCENT: 46 %
PLATELET COUNT: 244 10*3/uL (ref 150–379)
RED BLOOD CELL COUNT: 4.37 x10E6/uL (ref 4.14–5.80)
WHITE BLOOD CELL COUNT: 5 10*3/uL (ref 3.4–10.8)

## 2016-12-19 LAB — COMPREHENSIVE METABOLIC PANEL
A/G RATIO: 1.6 (ref 1.2–2.2)
ALBUMIN: 4.3 g/dL (ref 3.5–5.5)
ALKALINE PHOSPHATASE: 90 IU/L (ref 39–117)
ALT (SGPT): 33 IU/L (ref 0–44)
AST (SGOT): 24 IU/L (ref 0–40)
BILIRUBIN TOTAL: 0.4 mg/dL (ref 0.0–1.2)
BLOOD UREA NITROGEN: 20 mg/dL (ref 6–24)
BUN / CREAT RATIO: 17 (ref 9–20)
CALCIUM: 9.4 mg/dL (ref 8.7–10.2)
CREATININE: 1.16 mg/dL (ref 0.76–1.27)
GLUCOSE: 149 mg/dL — ABNORMAL HIGH (ref 65–99)
POTASSIUM: 5.2 mmol/L (ref 3.5–5.2)
TOTAL PROTEIN: 7 g/dL (ref 6.0–8.5)

## 2016-12-19 LAB — BILIRUBIN DIRECT: Lab: 0.15

## 2016-12-19 LAB — PHOSPHORUS, SERUM: Lab: 4.4

## 2016-12-19 LAB — SODIUM: Lab: 139

## 2016-12-19 LAB — MAGNESIUM: Lab: 1.8

## 2016-12-19 LAB — WHITE BLOOD CELL COUNT: Lab: 5

## 2016-12-19 LAB — GAMMA GLUTAMYL TRANSFERASE: Lab: 22

## 2016-12-20 LAB — TACROLIMUS BLOOD: Lab: 7.5

## 2016-12-21 NOTE — Unmapped (Signed)
Received fax from [facility] notifying that pt did not refill his Glipizide. Called pt and he admitted he has nit been taking medication as prescribed. He stated he has been taking it every other day because his BG level are now around 170. Explained to him that 170, though it is an improvement from previous levels is still above normal. Recommended that he start taking it as prescribed. Pt verbalized understanding. Relayed info to Stanford Breed PharmD.

## 2016-12-26 LAB — CBC W/ DIFFERENTIAL
BANDED NEUTROPHILS ABSOLUTE COUNT: 0 10*3/uL (ref 0.0–0.1)
BASOPHILS ABSOLUTE COUNT: 0 10*3/uL (ref 0.0–0.2)
BASOPHILS RELATIVE PERCENT: 1 %
EOSINOPHILS ABSOLUTE COUNT: 0.3 10*3/uL (ref 0.0–0.4)
EOSINOPHILS RELATIVE PERCENT: 5 %
HEMATOCRIT: 39.5 % (ref 37.5–51.0)
HEMOGLOBIN: 13 g/dL (ref 13.0–17.7)
IMMATURE GRANULOCYTES: 0 %
LYMPHOCYTES RELATIVE PERCENT: 25 %
MEAN CORPUSCULAR HEMOGLOBIN CONC: 32.9 g/dL (ref 31.5–35.7)
MEAN CORPUSCULAR HEMOGLOBIN: 29.3 pg (ref 26.6–33.0)
MEAN CORPUSCULAR VOLUME: 89 fL (ref 79–97)
MONOCYTES ABSOLUTE COUNT: 0.9 10*3/uL (ref 0.1–0.9)
MONOCYTES RELATIVE PERCENT: 17 %
NEUTROPHILS ABSOLUTE COUNT: 2.8 10*3/uL (ref 1.4–7.0)
NEUTROPHILS RELATIVE PERCENT: 52 %
RED BLOOD CELL COUNT: 4.44 x10E6/uL (ref 4.14–5.80)
RED CELL DISTRIBUTION WIDTH: 13.9 % (ref 12.3–15.4)
WHITE BLOOD CELL COUNT: 5.5 10*3/uL (ref 3.4–10.8)

## 2016-12-26 LAB — COMPREHENSIVE METABOLIC PANEL
ALBUMIN: 4.4 g/dL (ref 3.5–5.5)
ALKALINE PHOSPHATASE: 94 IU/L (ref 39–117)
ALT (SGPT): 30 IU/L (ref 0–44)
AST (SGOT): 19 IU/L (ref 0–40)
BILIRUBIN TOTAL: 0.4 mg/dL (ref 0.0–1.2)
BLOOD UREA NITROGEN: 20 mg/dL (ref 6–24)
BUN / CREAT RATIO: 15 (ref 9–20)
CALCIUM: 9.3 mg/dL (ref 8.7–10.2)
CHLORIDE: 103 mmol/L (ref 96–106)
CREATININE: 1.3 mg/dL — ABNORMAL HIGH (ref 0.76–1.27)
GLOBULIN, TOTAL: 2.7 g/dL (ref 1.5–4.5)
GLUCOSE: 115 mg/dL — ABNORMAL HIGH (ref 65–99)
POTASSIUM: 4.8 mmol/L (ref 3.5–5.2)
SODIUM: 141 mmol/L (ref 134–144)
TOTAL PROTEIN: 7.1 g/dL (ref 6.0–8.5)

## 2016-12-26 LAB — EOSINOPHILS ABSOLUTE COUNT: Lab: 0.3

## 2016-12-26 LAB — PHOSPHORUS, SERUM: Lab: 4.6 — ABNORMAL HIGH

## 2016-12-26 LAB — GAMMA GLUTAMYL TRANSFERASE: Lab: 25

## 2016-12-26 LAB — MAGNESIUM: Lab: 1.8

## 2016-12-26 LAB — CO2: Lab: 24

## 2016-12-26 LAB — BILIRUBIN DIRECT: Lab: 0.12

## 2016-12-27 LAB — TACROLIMUS BLOOD: Lab: 6.1

## 2016-12-31 NOTE — Unmapped (Signed)
DISENROLLED PER PT - FILLING AT [facility]

## 2017-01-09 MED ORDER — MAGNESIUM OXIDE 400 MG (241.3 MG MAGNESIUM) TABLET
ORAL_TABLET | Freq: Every day | ORAL | 11 refills | 0 days
Start: 2017-01-09 — End: 2017-01-21

## 2017-01-21 ENCOUNTER — Ambulatory Visit
Admission: RE | Admit: 2017-01-21 | Discharge: 2017-01-21 | Disposition: A | Discharge status: Home / Self Care | Payer: MEDICARE | Attending: Pharmacist Clinician (PhC)/ Clinical Pharmacy Specialist | Admitting: Pharmacist Clinician (PhC)/ Clinical Pharmacy Specialist

## 2017-01-21 ENCOUNTER — Ambulatory Visit
Admission: RE | Admit: 2017-01-21 | Discharge: 2017-01-21 | Disposition: A | Discharge status: Home / Self Care | Payer: MEDICARE | Attending: Family | Admitting: Family

## 2017-01-21 ENCOUNTER — Ambulatory Visit
Admission: RE | Admit: 2017-01-21 | Discharge: 2017-01-21 | Disposition: A | Discharge status: Home / Self Care | Payer: MEDICARE

## 2017-01-21 DIAGNOSIS — Z940 Kidney transplant status: Secondary | ICD-10-CM

## 2017-01-21 DIAGNOSIS — D899 Disorder involving the immune mechanism, unspecified: Secondary | ICD-10-CM

## 2017-01-21 DIAGNOSIS — R197 Diarrhea, unspecified: Secondary | ICD-10-CM

## 2017-01-21 DIAGNOSIS — E119 Type 2 diabetes mellitus without complications: Secondary | ICD-10-CM

## 2017-01-21 DIAGNOSIS — Z944 Liver transplant status: Principal | ICD-10-CM

## 2017-01-21 MED ORDER — MYFORTIC 180 MG TABLET,DELAYED RELEASE
ORAL_TABLET | Freq: Three times a day (TID) | ORAL | 11 refills | 0 days
Start: 2017-01-21 — End: 2017-03-22

## 2017-01-22 MED ORDER — PROGRAF 1 MG CAPSULE
ORAL_CAPSULE | ORAL | 11 refills | 0.00000 days | Status: CP
Start: 2017-01-22 — End: 2017-03-08

## 2017-01-22 MED ORDER — PROGRAF 1 MG CAPSULE: capsule | 11 refills | 0 days | Status: AC

## 2017-02-21 MED FILL — ATORVASTATIN/40MG/TABS: ATORVASTATIN/40MG/TABS | 30 days supply | Qty: 30 | Fill #3

## 2017-03-08 MED ORDER — PROGRAF 1 MG CAPSULE
ORAL_CAPSULE | Freq: Two times a day (BID) | ORAL | 11 refills | 0.00000 days | Status: CP
Start: 2017-03-08 — End: 2017-03-08

## 2017-03-08 MED ORDER — PROGRAF 1 MG CAPSULE: 5 mg | capsule | 11 refills | 0 days

## 2017-03-13 MED ORDER — PROGRAF 1 MG CAPSULE: 4 mg | capsule | Freq: Two times a day (BID) | 11 refills | 0 days | Status: AC

## 2017-03-13 MED ORDER — PROGRAF 1 MG CAPSULE
ORAL_CAPSULE | Freq: Two times a day (BID) | ORAL | 11 refills | 0.00000 days | Status: CP
Start: 2017-03-13 — End: 2017-03-19

## 2017-03-19 MED ORDER — PROGRAF 1 MG CAPSULE: capsule | 11 refills | 0 days

## 2017-03-19 MED ORDER — PROGRAF 1 MG CAPSULE
ORAL_CAPSULE | Freq: Two times a day (BID) | ORAL | 11 refills | 0.00000 days | Status: CP
Start: 2017-03-19 — End: 2017-03-19

## 2017-03-22 MED ORDER — MYFORTIC 180 MG TABLET,DELAYED RELEASE
ORAL_TABLET | Freq: Three times a day (TID) | ORAL | 11 refills | 0 days | Status: CP
Start: 2017-03-22 — End: 2017-05-16

## 2017-03-22 MED ORDER — PROGRAF 1 MG CAPSULE
ORAL_CAPSULE | Freq: Two times a day (BID) | ORAL | 11 refills | 0 days | Status: CP
Start: 2017-03-22 — End: 2017-06-21

## 2017-03-25 MED FILL — ATORVASTATIN/40MG/TABS: ATORVASTATIN/40MG/TABS | 30 days supply | Qty: 30 | Fill #4

## 2017-04-26 ENCOUNTER — Ambulatory Visit
Admission: RE | Admit: 2017-04-26 | Discharge: 2017-04-26 | Disposition: A | Discharge status: Home / Self Care | Payer: MEDICARE

## 2017-04-26 ENCOUNTER — Ambulatory Visit
Admission: RE | Admit: 2017-04-26 | Discharge: 2017-04-26 | Disposition: A | Discharge status: Home / Self Care | Attending: Family

## 2017-04-26 ENCOUNTER — Ambulatory Visit
Admission: RE | Admit: 2017-04-26 | Discharge: 2017-04-26 | Disposition: A | Discharge status: Home / Self Care | Payer: MEDICARE | Attending: Registered" | Admitting: Registered"

## 2017-04-26 ENCOUNTER — Ambulatory Visit
Admission: RE | Admit: 2017-04-26 | Discharge: 2017-04-26 | Disposition: A | Discharge status: Home / Self Care | Attending: Nephrology

## 2017-04-26 DIAGNOSIS — Z940 Kidney transplant status: Secondary | ICD-10-CM

## 2017-04-26 DIAGNOSIS — Z13220 Encounter for screening for lipoid disorders: Secondary | ICD-10-CM

## 2017-04-26 DIAGNOSIS — Z1159 Encounter for screening for other viral diseases: Secondary | ICD-10-CM

## 2017-04-26 DIAGNOSIS — E559 Vitamin D deficiency, unspecified: Secondary | ICD-10-CM

## 2017-04-26 DIAGNOSIS — E119 Type 2 diabetes mellitus without complications: Secondary | ICD-10-CM

## 2017-04-26 DIAGNOSIS — Z944 Liver transplant status: Principal | ICD-10-CM

## 2017-04-26 DIAGNOSIS — D899 Disorder involving the immune mechanism, unspecified: Secondary | ICD-10-CM

## 2017-04-26 DIAGNOSIS — Z23 Encounter for immunization: Secondary | ICD-10-CM

## 2017-04-26 DIAGNOSIS — Z0000 Encounter for general adult medical examination without abnormal findings: Secondary | ICD-10-CM

## 2017-04-26 DIAGNOSIS — Z7952 Long term (current) use of systemic steroids: Principal | ICD-10-CM

## 2017-04-26 DIAGNOSIS — Z114 Encounter for screening for human immunodeficiency virus [HIV]: Secondary | ICD-10-CM

## 2017-04-26 DIAGNOSIS — Z48298 Encounter for aftercare following other organ transplant: Secondary | ICD-10-CM

## 2017-05-10 MED FILL — ATORVASTATIN/40MG/TABS: ATORVASTATIN/40MG/TABS | 90 days supply | Qty: 90 | Fill #0

## 2017-05-16 MED ORDER — MYFORTIC 180 MG TABLET,DELAYED RELEASE
ORAL_TABLET | Freq: Three times a day (TID) | ORAL | 11 refills | 0 days | Status: CP
Start: 2017-05-16 — End: 2017-05-21

## 2017-05-21 MED ORDER — MYFORTIC 180 MG TABLET,DELAYED RELEASE
ORAL_TABLET | Freq: Three times a day (TID) | ORAL | 11 refills | 0 days | Status: CP
Start: 2017-05-21 — End: 2017-08-12

## 2017-06-21 MED ORDER — PROGRAF 1 MG CAPSULE
ORAL_CAPSULE | Freq: Two times a day (BID) | ORAL | 11 refills | 0.00000 days | Status: CP
Start: 2017-06-21 — End: 2018-06-11

## 2017-08-12 MED ORDER — MYFORTIC 180 MG TABLET,DELAYED RELEASE
ORAL_TABLET | Freq: Three times a day (TID) | ORAL | 11 refills | 0 days | Status: CP
Start: 2017-08-12 — End: 2018-08-26

## 2018-04-16 MED ORDER — ATORVASTATIN 40 MG TABLET
ORAL_TABLET | Freq: Every day | ORAL | 3 refills | 0 days | Status: CP
Start: 2018-04-16 — End: 2019-04-16
  Filled 2018-04-16: qty 90, 90d supply, fill #0

## 2018-04-16 MED FILL — ATORVASTATIN 40 MG TABLET: 90 days supply | Qty: 90 | Fill #0 | Status: AC

## 2018-04-30 ENCOUNTER — Ambulatory Visit: Admit: 2018-04-30 | Discharge: 2018-05-01 | Payer: MEDICARE

## 2018-04-30 DIAGNOSIS — R945 Abnormal results of liver function studies: Principal | ICD-10-CM

## 2018-05-13 ENCOUNTER — Ambulatory Visit: Admit: 2018-05-13 | Discharge: 2018-05-13 | Payer: MEDICARE

## 2018-05-13 IMAGING — CT CT ABD-PELV W/ CM
2 of 5 series · 9 of 46 positions shown, 10 images · IV contrast (iopamidol)
Comparison: 06/18/2014

CLINICAL DATA: Nausea and lower back pain for 1 month. Elevated
liver enzymes.

EXAM:
CT ABDOMEN AND PELVIS WITH CONTRAST
TECHNIQUE: Multidetector CT imaging of the abdomen and pelvis was performed
using the standard protocol following bolus administration of
intravenous contrast.
CONTRAST:  100mL 3SGRX3-9II IOPAMIDOL (3SGRX3-9II) INJECTION 61%

[Series 201: routine, idose (2) · axial · 0.71mm/px · z∈[+96,+516]mm · 6 of 108 slices shown, 7 images]
[im 12/108  soft-tissue]
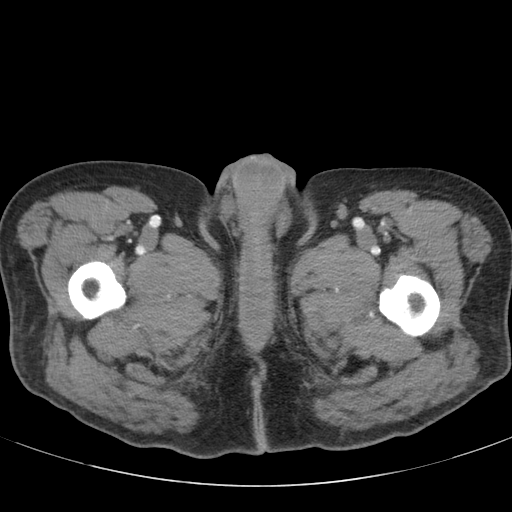
[im 12/108  bone]
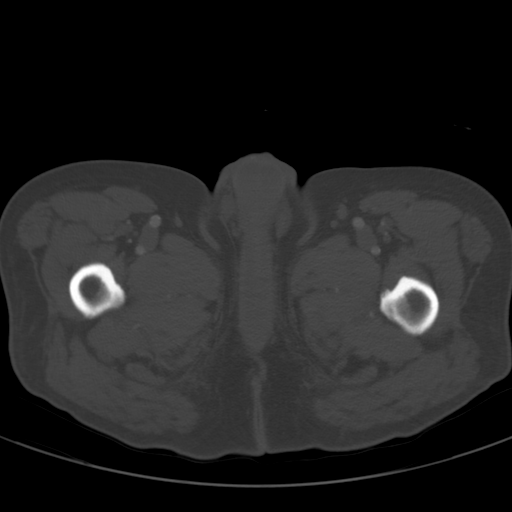
[im 29/108  soft-tissue]
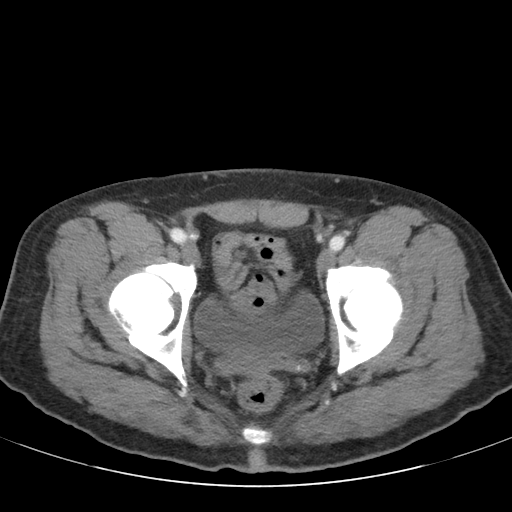
[im 46/108  soft-tissue]
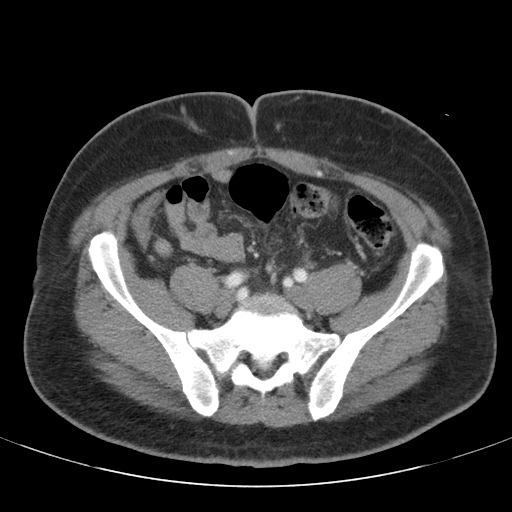
[im 62/108  soft-tissue]
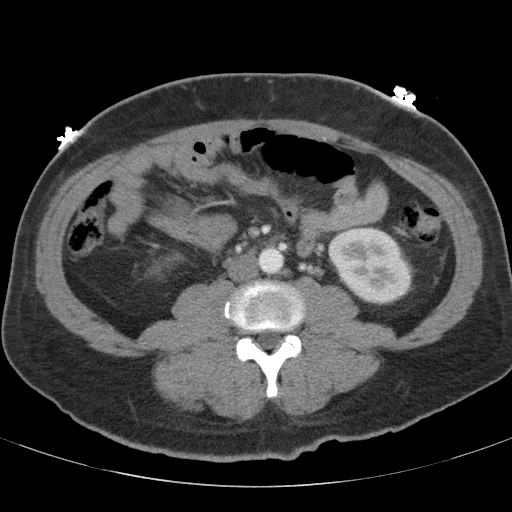
[im 79/108  soft-tissue]
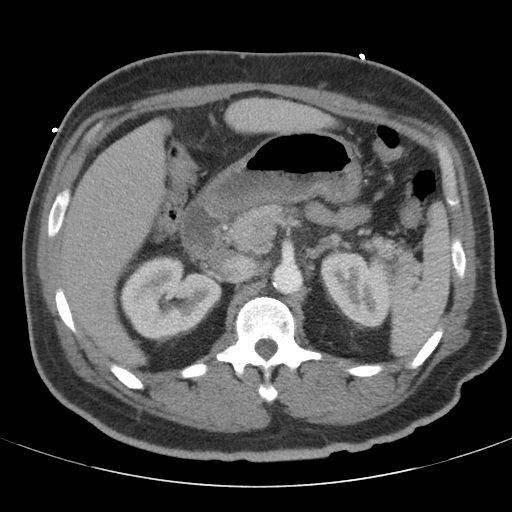
[im 96/108  soft-tissue]
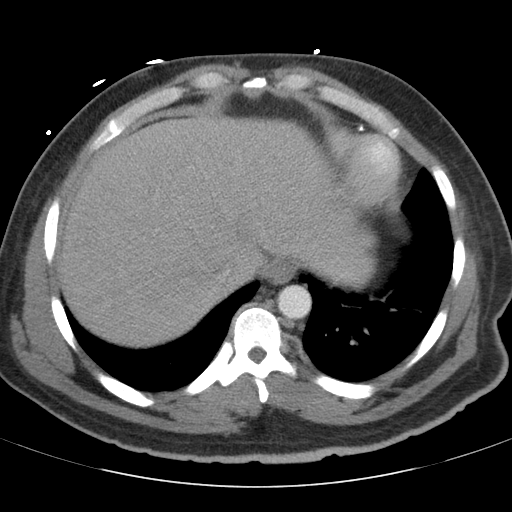

[Series 203: coronals, idose (2) · coronal · 0.45mm/px · 3 of 123 slices shown]
[im 41/123  soft-tissue]
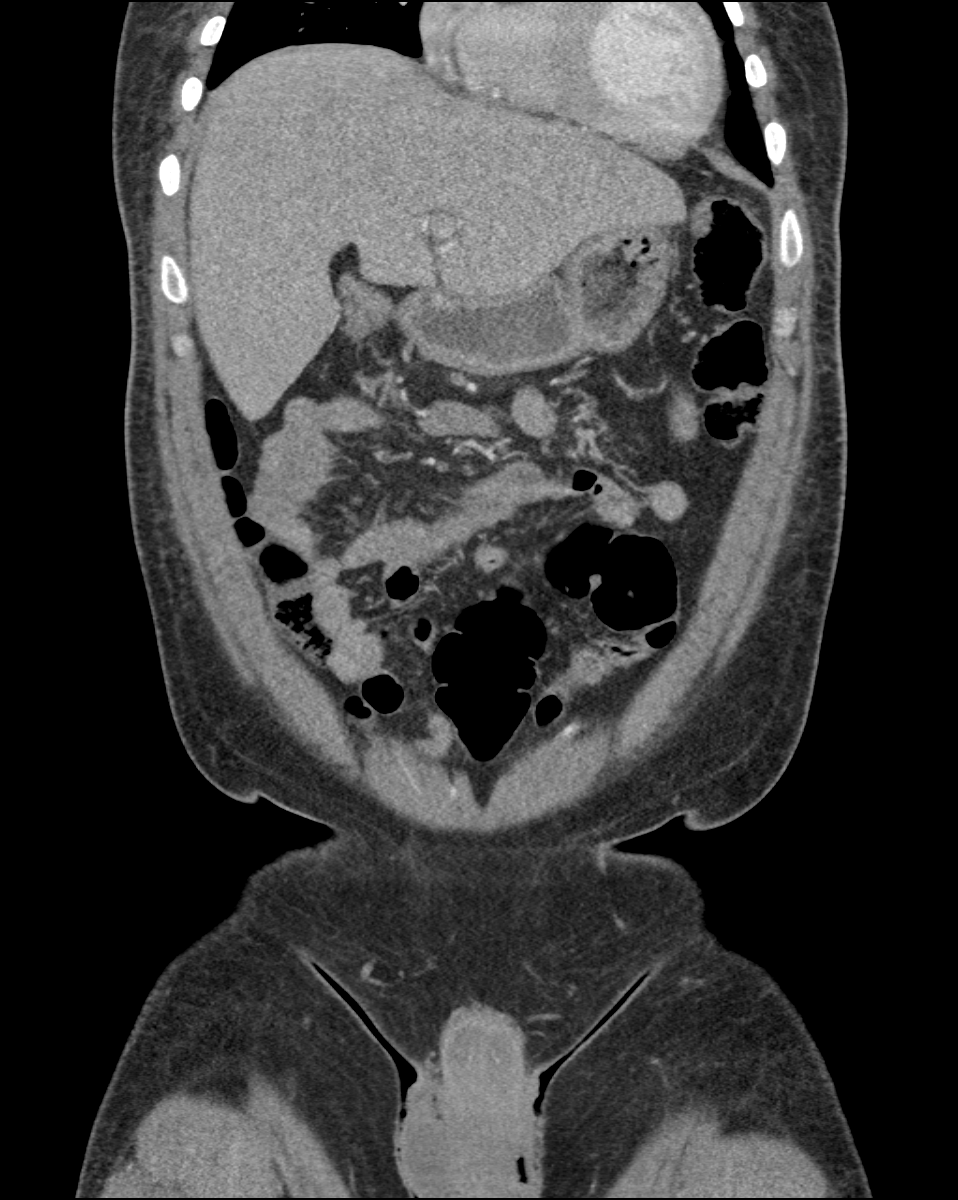
[im 55/123  soft-tissue]
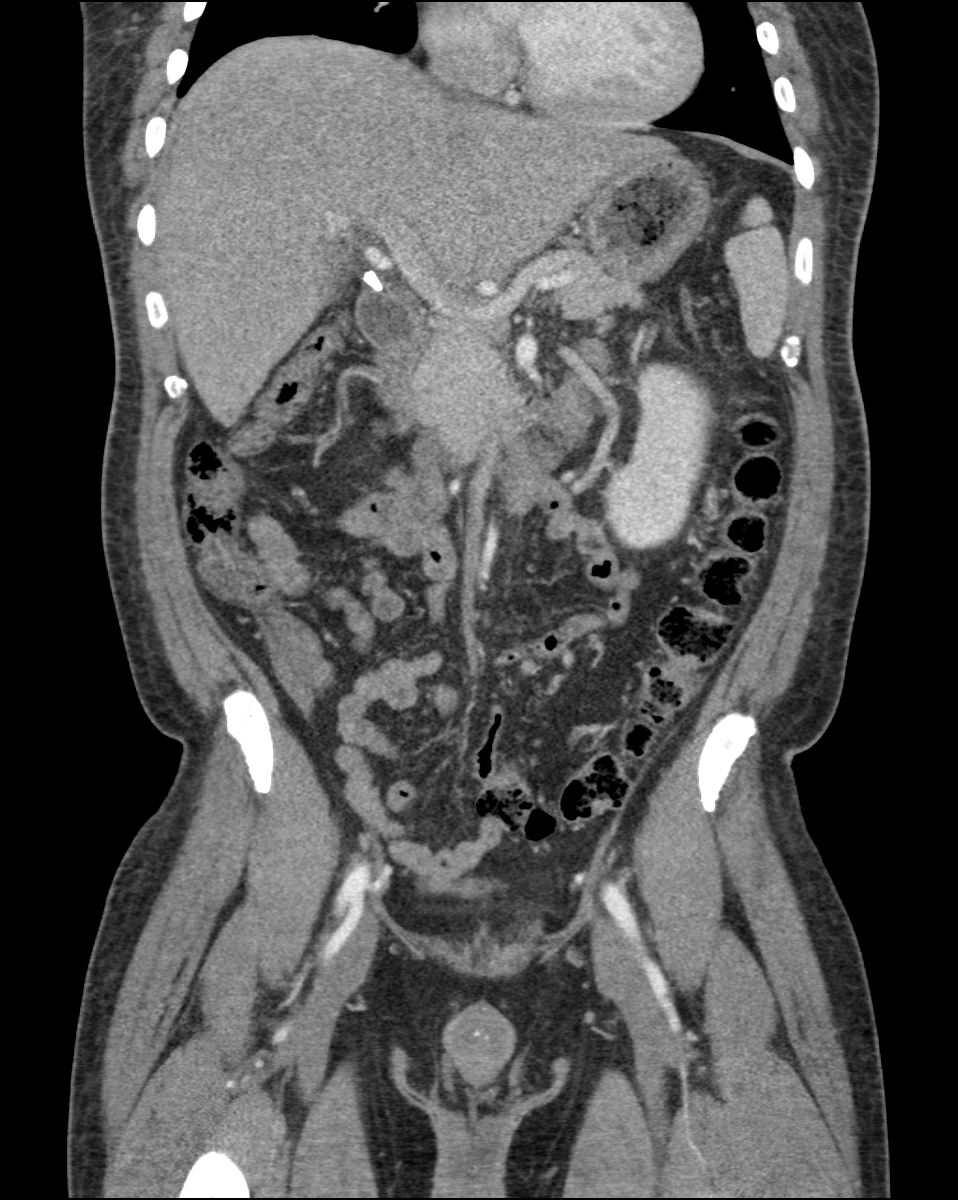
[im 68/123  soft-tissue]
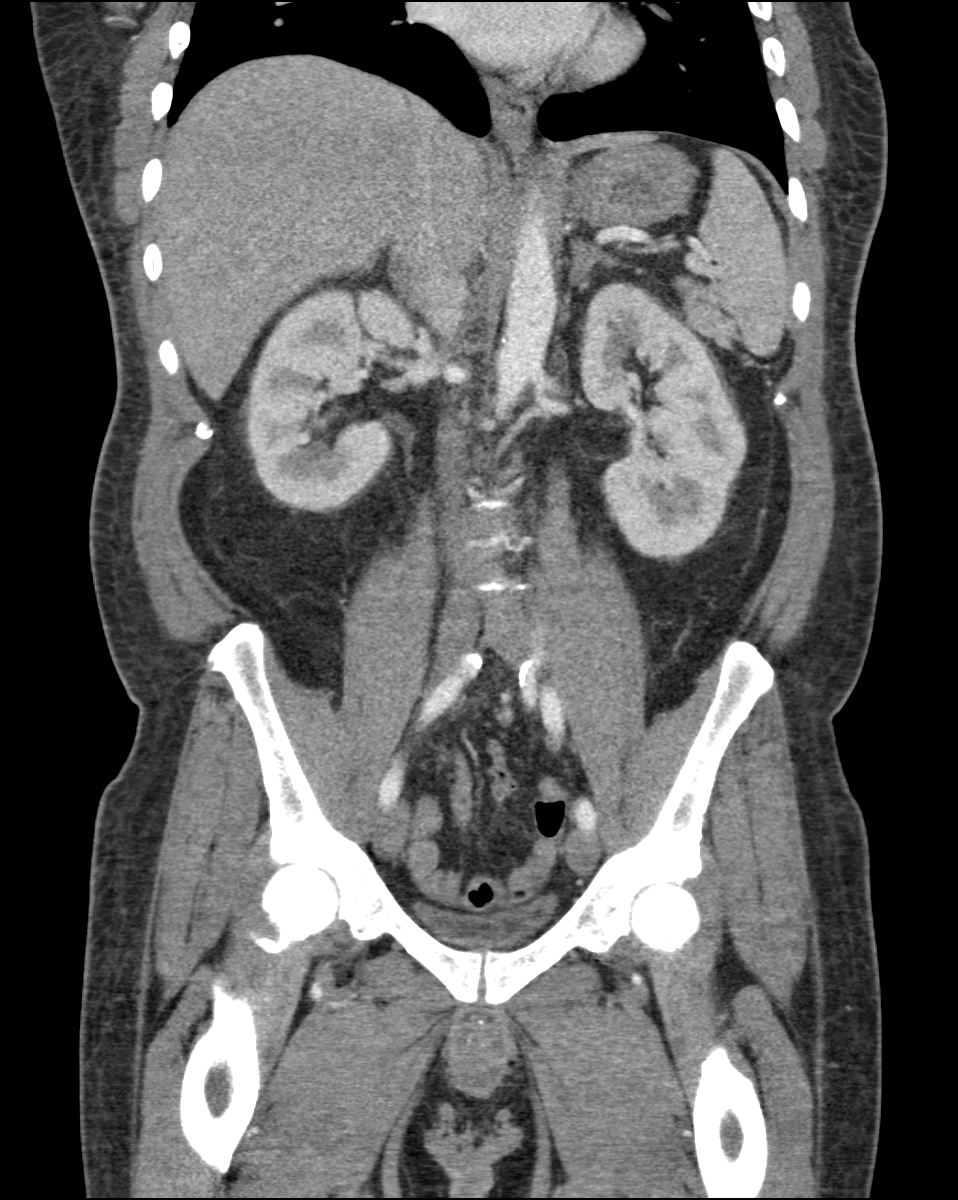

[9 of 46 positions shown; findings below may reference images not displayed]

FINDINGS: Lower chest and abdominal wall:  No contributory findings.

Hepatobiliary: Cirrhotic liver morphology with surface nodularity,
caudate enlargement, hepatic artery hypertrophy, and broad fissures.
No evidence of mass lesion. Superimposed low-density is presumably
steatosis. Contrast timing limits assessment of the portal venous
system. No convincing thrombosis. No splenomegaly or
ascites.Cholecystectomy with negative common bile duct.

Pancreas: Unremarkable.

Spleen: Unremarkable.

Adrenals/Urinary Tract: Negative adrenals. No hydronephrosis or
stone. Unremarkable bladder.

Stomach/Bowel:  No obstruction. No appendicitis.

Reproductive:No pathologic findings.

Vascular/Lymphatic: No acute vascular abnormality. Atherosclerotic
calcification of the aorta and iliacs. No mass or adenopathy.

Other: Trace pelvic fluid.

Musculoskeletal: No acute abnormalities. Chronic left L5 pars
defect.
IMPRESSION: 1. Cirrhotic liver morphology.  Correlate with risk factors.
2. Cholecystectomy.  No evidence of biliary obstruction.

## 2018-05-16 IMAGING — US IR US GUIDANCE
1 series · 8 of 8 positions shown · non-contrast
Comparison: CT the abdomen pelvis - 11/28/2015;

INDICATION: Elevated LFTs of uncertain etiology. Please perform
ultrasound-guided liver biopsy for tissue diagnostic purposes.

EXAM:
ULTRASOUND GUIDED LIVER BIOPSY

[Series 1: ir (id) (id)/(id)/(id) · 8 of 8 slices shown]
[im 1/8]
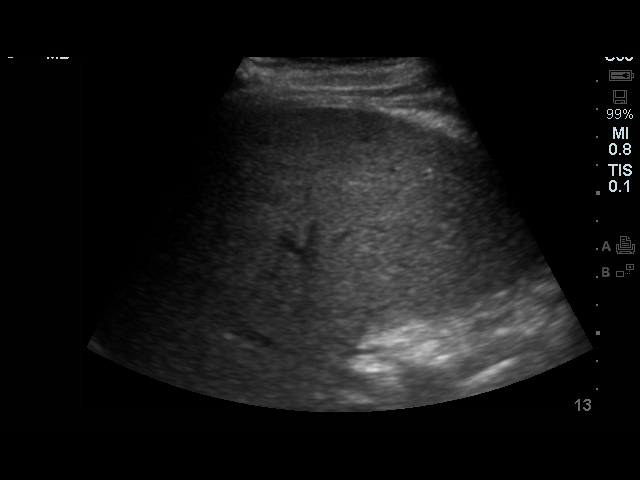
[im 2/8]
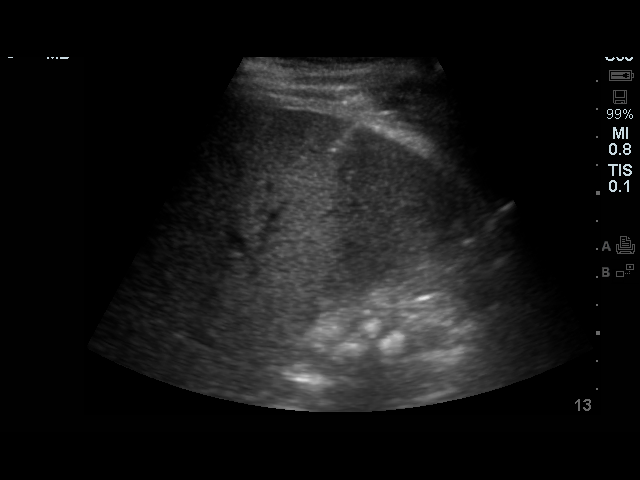
[im 3/8]
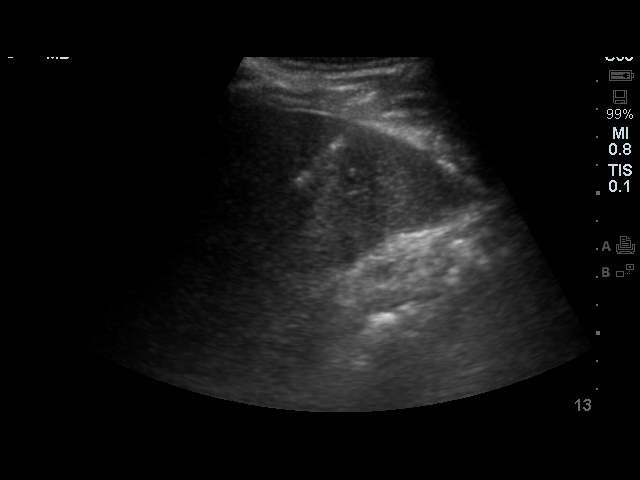
[im 4/8]
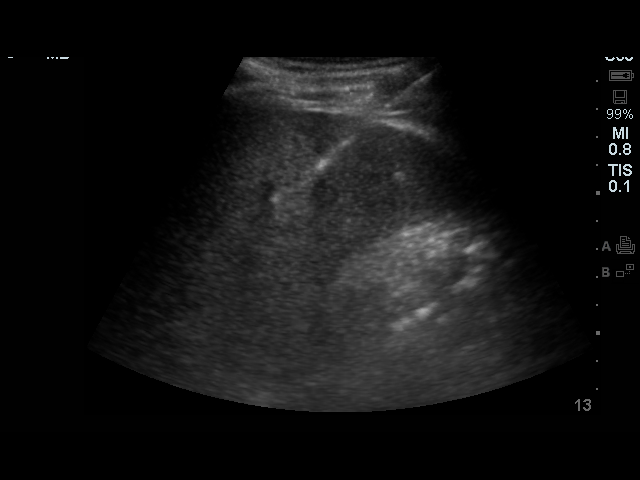
[im 5/8]
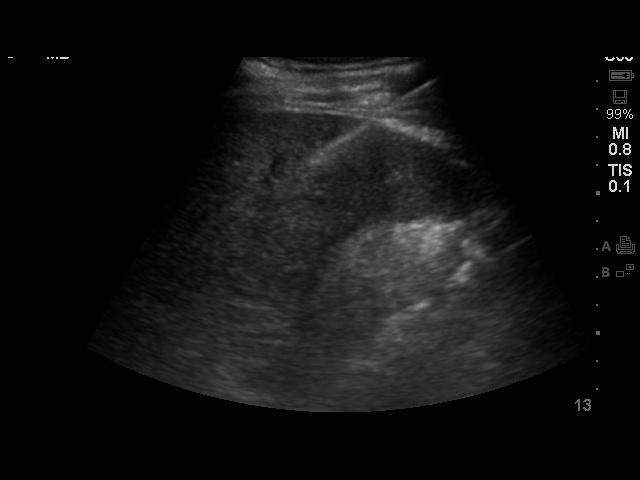
[im 6/8]
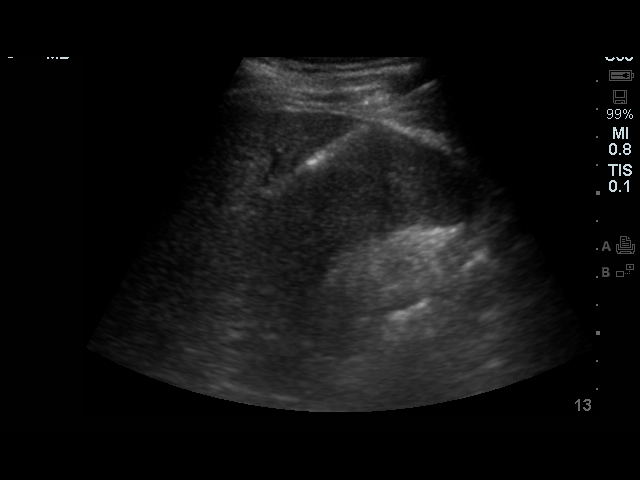
[im 7/8]
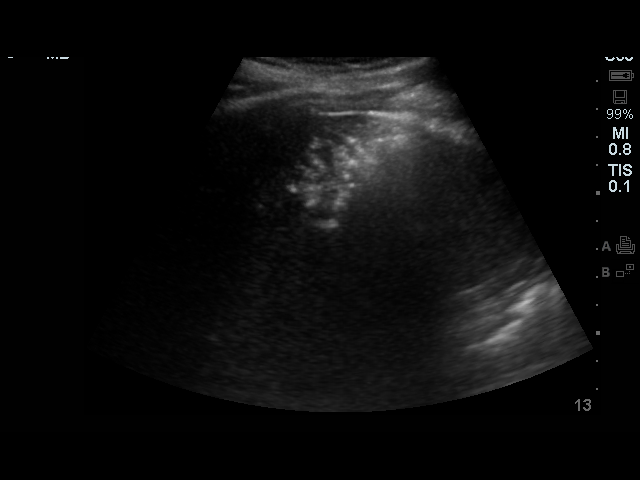
[im 8/8]
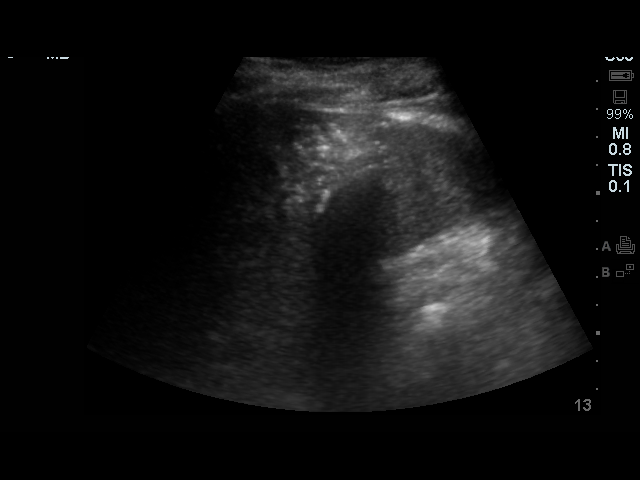

[8 of 8 positions shown; findings below may reference images not displayed]

right upper
quadrant abdominal ultrasound - 05/30/2015

MEDICATIONS:
Hydralazine 10 mg IV

ANESTHESIA/SEDATION:
Fentanyl 2 mcg IV; Versed 50 mg IV

Total Moderate Sedation time: 15 minutes; The patient was
continuously monitored during the procedure by the interventional
radiology nurse under my direct supervision.

COMPLICATIONS:
None immediate.

PROCEDURE:
Informed written consent was obtained from the patient after a
discussion of the risks, benefits and alternatives to treatment. The
patient understands and consents the procedure. A timeout was
performed prior to the initiation of the procedure.

Ultrasound scanning was performed of the right upper abdominal
quadrant and the procedure was planned. The right upper abdomen was
prepped and draped in the usual sterile fashion. The overlying soft
tissues were anesthetized with 1% lidocaine with epinephrine. A 17
gauge, 6.8 cm co-axial needle was advanced into a peripheral aspect
of the right lobe of the liver and 3 core biopsies were obtained
with an 18 gauge core device under direct ultrasound guidance.

The co-axial needle track was embolized with the administration of a
Gel-Foam slurry. Superficial hemostasis was obtained with manual
compression. Post procedural scanning was negative for definitive
area of hemorrhage. A dressing was placed. The patient tolerated the
procedure well without immediate post procedural complication.
IMPRESSION: Technically successful ultrasound guided liver biopsy.

## 2018-05-20 ENCOUNTER — Ambulatory Visit: Admit: 2018-05-20 | Discharge: 2018-05-20 | Payer: MEDICARE

## 2018-05-20 DIAGNOSIS — R945 Abnormal results of liver function studies: Principal | ICD-10-CM

## 2018-05-20 DIAGNOSIS — Z944 Liver transplant status: Secondary | ICD-10-CM

## 2018-05-30 ENCOUNTER — Ambulatory Visit: Admit: 2018-05-30 | Discharge: 2018-05-30 | Payer: MEDICARE

## 2018-05-31 ENCOUNTER — Ambulatory Visit: Admit: 2018-05-31 | Discharge: 2018-06-01 | Payer: MEDICARE

## 2018-05-31 DIAGNOSIS — Z944 Liver transplant status: Secondary | ICD-10-CM

## 2018-05-31 DIAGNOSIS — I10 Essential (primary) hypertension: Principal | ICD-10-CM

## 2018-05-31 DIAGNOSIS — T8649 Other complications of liver transplant: Secondary | ICD-10-CM

## 2018-05-31 DIAGNOSIS — R945 Abnormal results of liver function studies: Secondary | ICD-10-CM

## 2018-06-10 ENCOUNTER — Ambulatory Visit: Admit: 2018-06-10 | Discharge: 2018-06-10 | Payer: MEDICARE

## 2018-06-10 DIAGNOSIS — Z5181 Encounter for therapeutic drug level monitoring: Secondary | ICD-10-CM

## 2018-06-10 DIAGNOSIS — I5089 Other heart failure: Principal | ICD-10-CM

## 2018-06-10 DIAGNOSIS — Z944 Liver transplant status: Principal | ICD-10-CM

## 2018-06-10 DIAGNOSIS — D899 Disorder involving the immune mechanism, unspecified: Secondary | ICD-10-CM

## 2018-06-10 DIAGNOSIS — Z23 Encounter for immunization: Secondary | ICD-10-CM

## 2018-06-10 DIAGNOSIS — Z940 Kidney transplant status: Principal | ICD-10-CM

## 2018-06-10 DIAGNOSIS — I871 Compression of vein: Secondary | ICD-10-CM

## 2018-06-11 MED ORDER — PROGRAF 1 MG CAPSULE
ORAL_CAPSULE | 11 refills | 0 days | Status: CP
Start: 2018-06-11 — End: 2018-10-07

## 2018-07-14 DIAGNOSIS — Z944 Liver transplant status: Principal | ICD-10-CM

## 2018-07-14 DIAGNOSIS — Z79899 Other long term (current) drug therapy: Principal | ICD-10-CM

## 2018-07-24 MED FILL — ATORVASTATIN 40 MG TABLET: ORAL | 90 days supply | Qty: 90 | Fill #1

## 2018-07-24 MED FILL — ATORVASTATIN 40 MG TABLET: 90 days supply | Qty: 90 | Fill #1 | Status: AC

## 2018-08-11 DIAGNOSIS — Z944 Liver transplant status: Principal | ICD-10-CM

## 2018-08-11 DIAGNOSIS — Z79899 Other long term (current) drug therapy: Principal | ICD-10-CM

## 2018-08-26 MED ORDER — MYFORTIC 180 MG TABLET,DELAYED RELEASE
ORAL_TABLET | Freq: Three times a day (TID) | ORAL | 11 refills | 0 days | Status: CP
Start: 2018-08-26 — End: 2019-08-26

## 2018-10-07 MED ORDER — PROGRAF 1 MG CAPSULE
ORAL_CAPSULE | Freq: Two times a day (BID) | ORAL | 11 refills | 0.00000 days | Status: CP
Start: 2018-10-07 — End: ?

## 2018-10-30 MED FILL — ATORVASTATIN 40 MG TABLET: ORAL | 90 days supply | Qty: 90 | Fill #2

## 2018-10-30 MED FILL — ATORVASTATIN 40 MG TABLET: 90 days supply | Qty: 90 | Fill #2 | Status: AC

## 2019-01-23 DIAGNOSIS — Z5181 Encounter for therapeutic drug level monitoring: Secondary | ICD-10-CM

## 2019-01-23 DIAGNOSIS — Z944 Liver transplant status: Secondary | ICD-10-CM

## 2019-01-26 DIAGNOSIS — Z944 Liver transplant status: Secondary | ICD-10-CM

## 2019-01-26 DIAGNOSIS — Z79899 Other long term (current) drug therapy: Secondary | ICD-10-CM

## 2019-01-26 MED FILL — ATORVASTATIN 40 MG TABLET: 90 days supply | Qty: 90 | Fill #3 | Status: AC

## 2019-01-26 MED FILL — ATORVASTATIN 40 MG TABLET: ORAL | 90 days supply | Qty: 90 | Fill #3

## 2019-02-04 DIAGNOSIS — Z940 Kidney transplant status: Secondary | ICD-10-CM

## 2019-02-04 DIAGNOSIS — Z944 Liver transplant status: Secondary | ICD-10-CM

## 2019-02-04 MED ORDER — PROGRAF 1 MG CAPSULE
ORAL_CAPSULE | 11 refills | 0 days | Status: CP
Start: 2019-02-04 — End: ?

## 2019-02-23 DIAGNOSIS — Z944 Liver transplant status: Principal | ICD-10-CM

## 2019-02-23 DIAGNOSIS — Z79899 Other long term (current) drug therapy: Principal | ICD-10-CM

## 2019-03-23 DIAGNOSIS — Z944 Liver transplant status: Principal | ICD-10-CM

## 2019-03-23 DIAGNOSIS — Z79899 Other long term (current) drug therapy: Principal | ICD-10-CM

## 2019-04-20 DIAGNOSIS — Z79899 Other long term (current) drug therapy: Principal | ICD-10-CM

## 2019-04-20 DIAGNOSIS — Z944 Liver transplant status: Principal | ICD-10-CM

## 2019-04-22 MED ORDER — ATORVASTATIN 40 MG TABLET
ORAL_TABLET | Freq: Every day | ORAL | 3 refills | 90.00000 days | Status: CP
Start: 2019-04-22 — End: 2020-04-21
  Filled 2019-04-23: qty 90, 90d supply, fill #0

## 2019-04-23 MED FILL — ATORVASTATIN 40 MG TABLET: 90 days supply | Qty: 90 | Fill #0 | Status: AC

## 2019-05-18 DIAGNOSIS — Z944 Liver transplant status: Principal | ICD-10-CM

## 2019-05-18 DIAGNOSIS — Z79899 Other long term (current) drug therapy: Principal | ICD-10-CM

## 2019-05-22 DIAGNOSIS — Z944 Liver transplant status: Principal | ICD-10-CM

## 2019-05-22 DIAGNOSIS — Z940 Kidney transplant status: Principal | ICD-10-CM

## 2019-05-22 MED ORDER — TACROLIMUS 1 MG CAPSULE
ORAL_CAPSULE | 11 refills | 0 days | Status: CP
Start: 2019-05-22 — End: ?

## 2019-05-22 MED ORDER — MYCOPHENOLATE SODIUM 180 MG TABLET,DELAYED RELEASE
ORAL_TABLET | Freq: Three times a day (TID) | ORAL | 11 refills | 30 days | Status: CP
Start: 2019-05-22 — End: ?

## 2019-06-15 DIAGNOSIS — Z944 Liver transplant status: Principal | ICD-10-CM

## 2019-06-15 DIAGNOSIS — Z79899 Other long term (current) drug therapy: Principal | ICD-10-CM

## 2019-06-25 DIAGNOSIS — Z944 Liver transplant status: Principal | ICD-10-CM

## 2019-07-27 MED FILL — ATORVASTATIN 40 MG TABLET: 90 days supply | Qty: 90 | Fill #1 | Status: AC

## 2019-07-27 MED FILL — ATORVASTATIN 40 MG TABLET: ORAL | 90 days supply | Qty: 90 | Fill #1

## 2019-10-28 MED FILL — ATORVASTATIN 40 MG TABLET: ORAL | 90 days supply | Qty: 90 | Fill #2

## 2019-10-28 MED FILL — ATORVASTATIN 40 MG TABLET: 90 days supply | Qty: 90 | Fill #2 | Status: AC

## 2019-11-30 DIAGNOSIS — Z5181 Encounter for therapeutic drug level monitoring: Principal | ICD-10-CM

## 2019-11-30 DIAGNOSIS — Z940 Kidney transplant status: Principal | ICD-10-CM

## 2019-11-30 DIAGNOSIS — Z944 Liver transplant status: Principal | ICD-10-CM

## 2019-11-30 MED ORDER — TACROLIMUS 1 MG CAPSULE, IMMEDIATE-RELEASE
ORAL_CAPSULE | Freq: Two times a day (BID) | ORAL | 11 refills | 30.00000 days | Status: CP
Start: 2019-11-30 — End: ?

## 2019-12-28 DIAGNOSIS — Z940 Kidney transplant status: Principal | ICD-10-CM

## 2019-12-28 DIAGNOSIS — Z944 Liver transplant status: Principal | ICD-10-CM

## 2019-12-28 DIAGNOSIS — Z5181 Encounter for therapeutic drug level monitoring: Principal | ICD-10-CM

## 2020-01-25 DIAGNOSIS — Z940 Kidney transplant status: Principal | ICD-10-CM

## 2020-01-25 DIAGNOSIS — Z5181 Encounter for therapeutic drug level monitoring: Principal | ICD-10-CM

## 2020-01-25 DIAGNOSIS — Z944 Liver transplant status: Principal | ICD-10-CM

## 2020-01-28 MED FILL — ATORVASTATIN 40 MG TABLET: ORAL | 90 days supply | Qty: 90 | Fill #3

## 2020-01-28 MED FILL — ATORVASTATIN 40 MG TABLET: 90 days supply | Qty: 90 | Fill #3 | Status: AC

## 2020-02-03 DIAGNOSIS — R7309 Other abnormal glucose: Principal | ICD-10-CM

## 2020-02-03 DIAGNOSIS — E119 Type 2 diabetes mellitus without complications: Principal | ICD-10-CM

## 2020-02-17 DIAGNOSIS — Z944 Liver transplant status: Principal | ICD-10-CM

## 2020-02-17 DIAGNOSIS — Z940 Kidney transplant status: Principal | ICD-10-CM

## 2020-02-22 DIAGNOSIS — Z944 Liver transplant status: Principal | ICD-10-CM

## 2020-02-22 DIAGNOSIS — Z5181 Encounter for therapeutic drug level monitoring: Principal | ICD-10-CM

## 2020-02-22 DIAGNOSIS — Z940 Kidney transplant status: Principal | ICD-10-CM

## 2020-03-03 ENCOUNTER — Ambulatory Visit: Admit: 2020-03-03 | Discharge: 2020-03-04 | Payer: MEDICARE

## 2020-03-03 DIAGNOSIS — Z5181 Encounter for therapeutic drug level monitoring: Principal | ICD-10-CM

## 2020-03-03 DIAGNOSIS — Z944 Liver transplant status: Principal | ICD-10-CM

## 2020-04-18 DIAGNOSIS — Z944 Liver transplant status: Principal | ICD-10-CM

## 2020-04-18 DIAGNOSIS — Z940 Kidney transplant status: Principal | ICD-10-CM

## 2020-04-18 DIAGNOSIS — Z5181 Encounter for therapeutic drug level monitoring: Principal | ICD-10-CM

## 2020-05-04 MED ORDER — ATORVASTATIN 40 MG TABLET
ORAL_TABLET | Freq: Every day | ORAL | 3 refills | 90.00000 days | Status: CN
Start: 2020-05-04 — End: 2021-05-04

## 2020-05-09 MED ORDER — ATORVASTATIN 40 MG TABLET
ORAL_TABLET | Freq: Every day | ORAL | 3 refills | 90 days | Status: CP
Start: 2020-05-09 — End: 2021-05-09
  Filled 2020-05-09: qty 90, 90d supply, fill #0

## 2020-05-09 MED FILL — ATORVASTATIN 40 MG TABLET: 90 days supply | Qty: 90 | Fill #0 | Status: AC

## 2020-05-16 DIAGNOSIS — Z940 Kidney transplant status: Principal | ICD-10-CM

## 2020-05-16 DIAGNOSIS — Z944 Liver transplant status: Principal | ICD-10-CM

## 2020-05-16 DIAGNOSIS — Z5181 Encounter for therapeutic drug level monitoring: Principal | ICD-10-CM

## 2020-06-07 DIAGNOSIS — Z944 Liver transplant status: Principal | ICD-10-CM

## 2020-06-07 MED ORDER — MYCOPHENOLATE SODIUM 180 MG TABLET,DELAYED RELEASE
ORAL_TABLET | 0 refills | 0 days
Start: 2020-06-07 — End: ?

## 2020-06-08 DIAGNOSIS — Z944 Liver transplant status: Principal | ICD-10-CM

## 2020-06-08 DIAGNOSIS — Z940 Kidney transplant status: Principal | ICD-10-CM

## 2020-06-08 MED ORDER — TACROLIMUS 1 MG CAPSULE, IMMEDIATE-RELEASE
ORAL_CAPSULE | Freq: Two times a day (BID) | ORAL | 11 refills | 30 days | Status: CP
Start: 2020-06-08 — End: ?

## 2020-06-08 MED ORDER — MYCOPHENOLATE SODIUM 180 MG TABLET,DELAYED RELEASE
ORAL_TABLET | Freq: Three times a day (TID) | ORAL | 11 refills | 30 days | Status: CP
Start: 2020-06-08 — End: ?

## 2020-06-13 DIAGNOSIS — Z944 Liver transplant status: Principal | ICD-10-CM

## 2020-06-13 DIAGNOSIS — Z5181 Encounter for therapeutic drug level monitoring: Principal | ICD-10-CM

## 2020-06-13 DIAGNOSIS — Z940 Kidney transplant status: Principal | ICD-10-CM

## 2020-08-08 MED FILL — ATORVASTATIN 40 MG TABLET: ORAL | 90 days supply | Qty: 90 | Fill #1

## 2020-08-24 DIAGNOSIS — Z79899 Other long term (current) drug therapy: Principal | ICD-10-CM

## 2020-08-24 DIAGNOSIS — Z944 Liver transplant status: Principal | ICD-10-CM

## 2020-08-26 DIAGNOSIS — Z1159 Encounter for screening for other viral diseases: Principal | ICD-10-CM

## 2020-08-26 DIAGNOSIS — B259 Cytomegaloviral disease, unspecified: Principal | ICD-10-CM

## 2020-08-26 DIAGNOSIS — Z940 Kidney transplant status: Principal | ICD-10-CM

## 2020-08-26 DIAGNOSIS — B2790 Infectious mononucleosis, unspecified without complication: Principal | ICD-10-CM

## 2020-08-26 DIAGNOSIS — B172 Acute hepatitis E: Principal | ICD-10-CM

## 2020-08-26 DIAGNOSIS — Z944 Liver transplant status: Principal | ICD-10-CM

## 2020-09-12 DIAGNOSIS — R7989 Other specified abnormal findings of blood chemistry: Principal | ICD-10-CM

## 2020-09-12 DIAGNOSIS — Z944 Liver transplant status: Principal | ICD-10-CM

## 2020-09-19 DIAGNOSIS — Z944 Liver transplant status: Principal | ICD-10-CM

## 2020-09-19 DIAGNOSIS — Z79899 Other long term (current) drug therapy: Principal | ICD-10-CM

## 2020-09-28 ENCOUNTER — Ambulatory Visit: Admit: 2020-09-28 | Discharge: 2020-09-28 | Payer: MEDICARE

## 2020-09-28 DIAGNOSIS — R7989 Other specified abnormal findings of blood chemistry: Principal | ICD-10-CM

## 2020-09-28 DIAGNOSIS — Z944 Liver transplant status: Principal | ICD-10-CM

## 2020-09-30 DIAGNOSIS — I871 Compression of vein: Principal | ICD-10-CM

## 2020-09-30 DIAGNOSIS — T8649 Other complications of liver transplant: Principal | ICD-10-CM

## 2020-09-30 DIAGNOSIS — Z944 Liver transplant status: Principal | ICD-10-CM

## 2020-10-17 DIAGNOSIS — Z79899 Other long term (current) drug therapy: Principal | ICD-10-CM

## 2020-10-17 DIAGNOSIS — Z944 Liver transplant status: Principal | ICD-10-CM

## 2020-10-19 DIAGNOSIS — T8641 Liver transplant rejection: Principal | ICD-10-CM

## 2020-10-19 DIAGNOSIS — Z944 Liver transplant status: Principal | ICD-10-CM

## 2020-10-19 MED ORDER — PREDNISONE 10 MG TABLET
ORAL_TABLET | Freq: Every day | ORAL | 1 refills | 30 days | Status: CP
Start: 2020-10-19 — End: ?

## 2020-10-21 DIAGNOSIS — Z944 Liver transplant status: Principal | ICD-10-CM

## 2020-10-21 DIAGNOSIS — R7989 Other specified abnormal findings of blood chemistry: Principal | ICD-10-CM

## 2020-11-07 MED FILL — ATORVASTATIN 40 MG TABLET: ORAL | 90 days supply | Qty: 90 | Fill #2

## 2020-11-14 DIAGNOSIS — Z944 Liver transplant status: Principal | ICD-10-CM

## 2020-11-14 DIAGNOSIS — Z79899 Other long term (current) drug therapy: Principal | ICD-10-CM

## 2020-12-09 ENCOUNTER — Ambulatory Visit: Admit: 2020-12-09 | Discharge: 2020-12-09 | Payer: MEDICARE

## 2020-12-09 DIAGNOSIS — Z944 Liver transplant status: Principal | ICD-10-CM

## 2020-12-09 DIAGNOSIS — R7989 Other specified abnormal findings of blood chemistry: Principal | ICD-10-CM

## 2020-12-12 DIAGNOSIS — Z79899 Other long term (current) drug therapy: Principal | ICD-10-CM

## 2020-12-12 DIAGNOSIS — Z944 Liver transplant status: Principal | ICD-10-CM

## 2020-12-13 DIAGNOSIS — Z944 Liver transplant status: Principal | ICD-10-CM

## 2020-12-13 DIAGNOSIS — Z940 Kidney transplant status: Principal | ICD-10-CM

## 2020-12-13 MED ORDER — PREDNISONE 10 MG TABLET
ORAL_TABLET | 0 refills | 0 days
Start: 2020-12-13 — End: ?

## 2020-12-13 MED ORDER — TACROLIMUS 1 MG CAPSULE, IMMEDIATE-RELEASE
ORAL_CAPSULE | 0 refills | 0 days
Start: 2020-12-13 — End: ?

## 2020-12-16 DIAGNOSIS — Z944 Liver transplant status: Principal | ICD-10-CM

## 2020-12-16 DIAGNOSIS — Z940 Kidney transplant status: Principal | ICD-10-CM

## 2020-12-16 MED ORDER — TACROLIMUS 1 MG CAPSULE, IMMEDIATE-RELEASE
ORAL_CAPSULE | Freq: Two times a day (BID) | ORAL | 11 refills | 30 days | Status: CP
Start: 2020-12-16 — End: ?

## 2020-12-17 DIAGNOSIS — Z944 Liver transplant status: Principal | ICD-10-CM

## 2020-12-17 MED ORDER — PREDNISONE 10 MG TABLET
ORAL_TABLET | Freq: Every day | ORAL | 1 refills | 30 days | Status: CP
Start: 2020-12-17 — End: ?

## 2020-12-23 DIAGNOSIS — Z944 Liver transplant status: Principal | ICD-10-CM

## 2020-12-23 DIAGNOSIS — Z5181 Encounter for therapeutic drug level monitoring: Principal | ICD-10-CM

## 2020-12-23 MED ORDER — PREDNISONE 10 MG TABLET
ORAL_TABLET | Freq: Every day | ORAL | 0 refills | 30 days | Status: CP
Start: 2020-12-23 — End: ?

## 2020-12-26 DIAGNOSIS — Z5181 Encounter for therapeutic drug level monitoring: Principal | ICD-10-CM

## 2020-12-26 DIAGNOSIS — Z944 Liver transplant status: Principal | ICD-10-CM

## 2021-01-02 DIAGNOSIS — Z944 Liver transplant status: Principal | ICD-10-CM

## 2021-01-02 DIAGNOSIS — Z5181 Encounter for therapeutic drug level monitoring: Principal | ICD-10-CM

## 2021-01-09 DIAGNOSIS — Z944 Liver transplant status: Principal | ICD-10-CM

## 2021-01-09 DIAGNOSIS — Z940 Kidney transplant status: Principal | ICD-10-CM

## 2021-01-09 DIAGNOSIS — Z5181 Encounter for therapeutic drug level monitoring: Principal | ICD-10-CM

## 2021-01-09 DIAGNOSIS — Z79899 Other long term (current) drug therapy: Principal | ICD-10-CM

## 2021-01-09 MED ORDER — MYCOPHENOLATE SODIUM 180 MG TABLET,DELAYED RELEASE
ORAL_TABLET | Freq: Three times a day (TID) | ORAL | 11 refills | 30 days | Status: CP
Start: 2021-01-09 — End: ?

## 2021-01-09 MED ORDER — PREDNISONE 10 MG TABLET
ORAL_TABLET | Freq: Every day | ORAL | 0 refills | 30 days | Status: CP
Start: 2021-01-09 — End: ?

## 2021-01-09 MED ORDER — TACROLIMUS 1 MG CAPSULE, IMMEDIATE-RELEASE
ORAL_CAPSULE | Freq: Two times a day (BID) | ORAL | 11 refills | 30 days | Status: CP
Start: 2021-01-09 — End: ?

## 2021-01-16 DIAGNOSIS — Z5181 Encounter for therapeutic drug level monitoring: Principal | ICD-10-CM

## 2021-01-16 DIAGNOSIS — Z944 Liver transplant status: Principal | ICD-10-CM

## 2021-02-01 DIAGNOSIS — Z944 Liver transplant status: Principal | ICD-10-CM

## 2021-02-01 MED ORDER — PREDNISONE 5 MG TABLET
ORAL_TABLET | Freq: Every day | ORAL | 0 refills | 30 days | Status: CP
Start: 2021-02-01 — End: ?

## 2021-02-06 DIAGNOSIS — Z79899 Other long term (current) drug therapy: Principal | ICD-10-CM

## 2021-02-06 DIAGNOSIS — Z944 Liver transplant status: Principal | ICD-10-CM

## 2021-03-02 DIAGNOSIS — Z5181 Encounter for therapeutic drug level monitoring: Principal | ICD-10-CM

## 2021-03-02 DIAGNOSIS — Z944 Liver transplant status: Principal | ICD-10-CM

## 2021-03-06 DIAGNOSIS — Z944 Liver transplant status: Principal | ICD-10-CM

## 2021-03-06 DIAGNOSIS — Z79899 Other long term (current) drug therapy: Principal | ICD-10-CM

## 2021-04-03 DIAGNOSIS — Z79899 Other long term (current) drug therapy: Principal | ICD-10-CM

## 2021-04-03 DIAGNOSIS — Z944 Liver transplant status: Principal | ICD-10-CM

## 2021-05-01 DIAGNOSIS — Z944 Liver transplant status: Principal | ICD-10-CM

## 2021-05-01 DIAGNOSIS — Z79899 Other long term (current) drug therapy: Principal | ICD-10-CM

## 2021-05-29 DIAGNOSIS — Z944 Liver transplant status: Principal | ICD-10-CM

## 2021-05-29 DIAGNOSIS — Z79899 Other long term (current) drug therapy: Principal | ICD-10-CM

## 2021-06-26 DIAGNOSIS — Z79899 Other long term (current) drug therapy: Principal | ICD-10-CM

## 2021-06-26 DIAGNOSIS — Z944 Liver transplant status: Principal | ICD-10-CM

## 2021-09-12 DIAGNOSIS — Z940 Kidney transplant status: Principal | ICD-10-CM

## 2021-10-20 ENCOUNTER — Ambulatory Visit: Admit: 2021-10-20 | Discharge: 2021-10-21 | Payer: MEDICARE

## 2021-10-20 DIAGNOSIS — D849 Immunodeficiency, unspecified: Principal | ICD-10-CM

## 2021-10-20 DIAGNOSIS — Z940 Kidney transplant status: Principal | ICD-10-CM

## 2021-10-20 DIAGNOSIS — Z944 Liver transplant status: Principal | ICD-10-CM

## 2021-10-20 DIAGNOSIS — Z23 Encounter for immunization: Principal | ICD-10-CM

## 2021-10-20 DIAGNOSIS — Z79899 Other long term (current) drug therapy: Principal | ICD-10-CM

## 2021-10-24 DIAGNOSIS — Z944 Liver transplant status: Principal | ICD-10-CM

## 2021-10-24 DIAGNOSIS — Z5181 Encounter for therapeutic drug level monitoring: Principal | ICD-10-CM

## 2021-12-18 DIAGNOSIS — Z944 Liver transplant status: Principal | ICD-10-CM

## 2021-12-18 DIAGNOSIS — Z5181 Encounter for therapeutic drug level monitoring: Principal | ICD-10-CM

## 2022-01-02 ENCOUNTER — Encounter: Admit: 2022-01-02 | Discharge: 2022-01-02 | Payer: MEDICARE

## 2022-01-02 ENCOUNTER — Ambulatory Visit: Admit: 2022-01-02 | Discharge: 2022-01-02 | Payer: MEDICARE

## 2022-01-12 DIAGNOSIS — Z940 Kidney transplant status: Principal | ICD-10-CM

## 2022-01-12 DIAGNOSIS — Z944 Liver transplant status: Principal | ICD-10-CM

## 2022-01-12 MED ORDER — TACROLIMUS 1 MG CAPSULE, IMMEDIATE-RELEASE
ORAL_CAPSULE | Freq: Two times a day (BID) | ORAL | 3 refills | 30 days | Status: CP
Start: 2022-01-12 — End: ?

## 2022-01-12 MED ORDER — MYCOPHENOLATE SODIUM 180 MG TABLET,DELAYED RELEASE
ORAL_TABLET | Freq: Three times a day (TID) | ORAL | 3 refills | 30 days | Status: CP
Start: 2022-01-12 — End: ?

## 2022-02-12 DIAGNOSIS — Z944 Liver transplant status: Principal | ICD-10-CM

## 2022-02-12 DIAGNOSIS — Z5181 Encounter for therapeutic drug level monitoring: Principal | ICD-10-CM

## 2022-03-05 ENCOUNTER — Encounter: Admit: 2022-03-05 | Discharge: 2022-03-05 | Payer: MEDICARE

## 2022-03-05 ENCOUNTER — Ambulatory Visit: Admit: 2022-03-05 | Discharge: 2022-03-05 | Payer: MEDICARE

## 2022-04-09 DIAGNOSIS — Z944 Liver transplant status: Principal | ICD-10-CM

## 2022-04-09 DIAGNOSIS — Z5181 Encounter for therapeutic drug level monitoring: Principal | ICD-10-CM

## 2022-05-10 DIAGNOSIS — Z940 Kidney transplant status: Principal | ICD-10-CM

## 2022-05-10 DIAGNOSIS — Z944 Liver transplant status: Principal | ICD-10-CM

## 2022-05-10 MED ORDER — TACROLIMUS 1 MG CAPSULE, IMMEDIATE-RELEASE
ORAL_CAPSULE | ORAL | 11 refills | 0 days | Status: CP
Start: 2022-05-10 — End: ?

## 2022-05-10 MED ORDER — MYCOPHENOLATE SODIUM 180 MG TABLET,DELAYED RELEASE
ORAL_TABLET | Freq: Three times a day (TID) | ORAL | 11 refills | 30 days | Status: CP
Start: 2022-05-10 — End: ?

## 2022-06-04 DIAGNOSIS — Z5181 Encounter for therapeutic drug level monitoring: Principal | ICD-10-CM

## 2022-06-04 DIAGNOSIS — Z944 Liver transplant status: Principal | ICD-10-CM

## 2022-07-02 DIAGNOSIS — Z79899 Other long term (current) drug therapy: Principal | ICD-10-CM

## 2022-07-02 DIAGNOSIS — R799 Abnormal finding of blood chemistry, unspecified: Principal | ICD-10-CM

## 2022-07-02 DIAGNOSIS — Z940 Kidney transplant status: Principal | ICD-10-CM

## 2022-07-05 ENCOUNTER — Ambulatory Visit: Admit: 2022-07-05 | Discharge: 2022-07-05 | Payer: MEDICARE | Attending: Nephrology | Primary: Nephrology

## 2022-07-05 ENCOUNTER — Ambulatory Visit: Admit: 2022-07-05 | Discharge: 2022-07-05 | Payer: MEDICARE

## 2022-07-05 DIAGNOSIS — Z79899 Other long term (current) drug therapy: Principal | ICD-10-CM

## 2022-07-05 DIAGNOSIS — R799 Abnormal finding of blood chemistry, unspecified: Principal | ICD-10-CM

## 2022-07-05 DIAGNOSIS — Z940 Kidney transplant status: Principal | ICD-10-CM

## 2022-07-30 DIAGNOSIS — Z5181 Encounter for therapeutic drug level monitoring: Principal | ICD-10-CM

## 2022-07-30 DIAGNOSIS — Z944 Liver transplant status: Principal | ICD-10-CM

## 2022-09-05 DIAGNOSIS — Z940 Kidney transplant status: Principal | ICD-10-CM

## 2022-09-24 DIAGNOSIS — Z944 Liver transplant status: Principal | ICD-10-CM

## 2022-09-24 DIAGNOSIS — Z5181 Encounter for therapeutic drug level monitoring: Principal | ICD-10-CM

## 2023-01-25 ENCOUNTER — Telehealth: Admit: 2023-01-25 | Discharge: 2023-01-26 | Payer: MEDICARE

## 2023-05-06 DIAGNOSIS — Z944 Liver transplant status: Principal | ICD-10-CM

## 2023-05-06 DIAGNOSIS — Z940 Kidney transplant status: Principal | ICD-10-CM

## 2023-05-06 MED ORDER — TACROLIMUS 1 MG CAPSULE, IMMEDIATE-RELEASE
ORAL_CAPSULE | ORAL | 11 refills | 0.00 days | Status: CP
Start: 2023-05-06 — End: ?

## 2023-05-06 MED ORDER — MYCOPHENOLATE SODIUM 180 MG TABLET,DELAYED RELEASE
ORAL_TABLET | Freq: Three times a day (TID) | ORAL | 11 refills | 30.00 days | Status: CP
Start: 2023-05-06 — End: ?

## 2023-05-16 DIAGNOSIS — Z5181 Encounter for therapeutic drug level monitoring: Principal | ICD-10-CM

## 2023-05-16 DIAGNOSIS — Z944 Liver transplant status: Principal | ICD-10-CM

## 2023-05-16 DIAGNOSIS — Z940 Kidney transplant status: Principal | ICD-10-CM

## 2023-06-15 DIAGNOSIS — Z944 Liver transplant status: Principal | ICD-10-CM

## 2023-06-15 MED ORDER — MYCOPHENOLATE SODIUM 180 MG TABLET,DELAYED RELEASE
ORAL_TABLET | Freq: Three times a day (TID) | ORAL | 11 refills | 0.00 days
Start: 2023-06-15 — End: ?

## 2023-06-17 MED ORDER — MYCOPHENOLATE SODIUM 180 MG TABLET,DELAYED RELEASE
ORAL_TABLET | Freq: Three times a day (TID) | ORAL | 11 refills | 30.00 days | Status: CP
Start: 2023-06-17 — End: ?

## 2023-07-08 DIAGNOSIS — Z940 Kidney transplant status: Principal | ICD-10-CM

## 2023-07-08 DIAGNOSIS — Z5181 Encounter for therapeutic drug level monitoring: Principal | ICD-10-CM

## 2023-07-08 DIAGNOSIS — Z944 Liver transplant status: Principal | ICD-10-CM

## 2023-07-16 DIAGNOSIS — I739 Peripheral vascular disease, unspecified: Principal | ICD-10-CM

## 2023-08-06 ENCOUNTER — Inpatient Hospital Stay: Admit: 2023-08-06 | Discharge: 2023-08-07 | Payer: MEDICARE

## 2023-08-14 DIAGNOSIS — Z944 Liver transplant status: Principal | ICD-10-CM

## 2023-08-14 DIAGNOSIS — Z940 Kidney transplant status: Principal | ICD-10-CM

## 2023-08-29 DIAGNOSIS — Z944 Liver transplant status: Principal | ICD-10-CM

## 2023-08-29 DIAGNOSIS — Z940 Kidney transplant status: Principal | ICD-10-CM

## 2023-08-30 ENCOUNTER — Inpatient Hospital Stay: Admit: 2023-08-30 | Discharge: 2023-08-30 | Payer: Medicare (Managed Care)

## 2023-08-30 ENCOUNTER — Ambulatory Visit: Admit: 2023-08-30 | Discharge: 2023-08-30 | Payer: Medicare (Managed Care) | Attending: Nephrology | Primary: Nephrology

## 2023-08-30 DIAGNOSIS — N186 End stage renal disease: Principal | ICD-10-CM

## 2023-08-30 DIAGNOSIS — Z940 Kidney transplant status: Principal | ICD-10-CM

## 2023-09-02 DIAGNOSIS — Z944 Liver transplant status: Principal | ICD-10-CM

## 2023-09-02 DIAGNOSIS — Z5181 Encounter for therapeutic drug level monitoring: Principal | ICD-10-CM

## 2023-09-02 DIAGNOSIS — Z940 Kidney transplant status: Principal | ICD-10-CM

## 2023-09-12 DIAGNOSIS — I739 Peripheral vascular disease, unspecified: Principal | ICD-10-CM

## 2023-10-18 DIAGNOSIS — Z944 Liver transplant status: Principal | ICD-10-CM

## 2023-10-21 ENCOUNTER — Ambulatory Visit: Admit: 2023-10-21 | Discharge: 2023-10-22 | Payer: Medicare (Managed Care)

## 2023-10-21 DIAGNOSIS — E11319 Type 2 diabetes mellitus with unspecified diabetic retinopathy without macular edema: Principal | ICD-10-CM

## 2023-10-21 DIAGNOSIS — I739 Peripheral vascular disease, unspecified: Principal | ICD-10-CM

## 2023-10-28 DIAGNOSIS — Z944 Liver transplant status: Principal | ICD-10-CM

## 2023-10-28 DIAGNOSIS — Z940 Kidney transplant status: Principal | ICD-10-CM

## 2023-10-28 DIAGNOSIS — Z5181 Encounter for therapeutic drug level monitoring: Principal | ICD-10-CM

## 2023-12-23 DIAGNOSIS — Z5181 Encounter for therapeutic drug level monitoring: Principal | ICD-10-CM

## 2023-12-23 DIAGNOSIS — Z944 Liver transplant status: Principal | ICD-10-CM

## 2023-12-23 DIAGNOSIS — Z940 Kidney transplant status: Principal | ICD-10-CM

## 2024-01-31 DIAGNOSIS — Z944 Liver transplant status: Principal | ICD-10-CM

## 2024-02-03 DIAGNOSIS — Z944 Liver transplant status: Principal | ICD-10-CM

## 2024-02-06 DIAGNOSIS — Z940 Kidney transplant status: Principal | ICD-10-CM

## 2024-02-07 ENCOUNTER — Ambulatory Visit: Admit: 2024-02-07 | Discharge: 2024-02-08 | Payer: Medicare (Managed Care)

## 2024-02-07 DIAGNOSIS — E875 Hyperkalemia: Principal | ICD-10-CM

## 2024-02-07 DIAGNOSIS — Z23 Encounter for immunization: Principal | ICD-10-CM

## 2024-02-07 DIAGNOSIS — D849 Immunodeficiency, unspecified: Principal | ICD-10-CM

## 2024-02-07 DIAGNOSIS — Z944 Liver transplant status: Principal | ICD-10-CM

## 2024-02-17 DIAGNOSIS — Z5181 Encounter for therapeutic drug level monitoring: Principal | ICD-10-CM

## 2024-02-17 DIAGNOSIS — Z944 Liver transplant status: Principal | ICD-10-CM

## 2024-02-17 DIAGNOSIS — Z940 Kidney transplant status: Principal | ICD-10-CM

## 2024-05-03 DIAGNOSIS — Z940 Kidney transplant status: Principal | ICD-10-CM

## 2024-05-03 DIAGNOSIS — Z944 Liver transplant status: Principal | ICD-10-CM

## 2024-05-03 MED ORDER — TACROLIMUS 1 MG CAPSULE, IMMEDIATE-RELEASE
ORAL_CAPSULE | ORAL | 11 refills | 0.00000 days
Start: 2024-05-03 — End: ?

## 2024-05-04 MED ORDER — TACROLIMUS 1 MG CAPSULE, IMMEDIATE-RELEASE
ORAL_CAPSULE | ORAL | 11 refills | 0.00000 days | Status: CP
Start: 2024-05-04 — End: ?

## 2024-05-11 DIAGNOSIS — Z944 Liver transplant status: Principal | ICD-10-CM

## 2024-05-27 DIAGNOSIS — Z940 Kidney transplant status: Principal | ICD-10-CM

## 2024-06-10 DIAGNOSIS — Z940 Kidney transplant status: Secondary | ICD-10-CM

## 2024-06-10 DIAGNOSIS — Z944 Liver transplant status: Principal | ICD-10-CM

## 2024-06-10 MED ORDER — MYCOPHENOLATE SODIUM 180 MG TABLET,DELAYED RELEASE
ORAL_TABLET | Freq: Three times a day (TID) | ORAL | 11 refills | 30.00000 days | Status: CP
Start: 2024-06-10 — End: 2025-06-10

## 2024-06-12 DIAGNOSIS — Z944 Liver transplant status: Principal | ICD-10-CM
